# Patient Record
Sex: Male | Born: 1960 | Hispanic: Yes | Marital: Married | Smoking: Former smoker
Health system: Southern US, Community
[De-identification: ages and names within clinical notes are randomized; demographics above are authoritative.]

## PROBLEM LIST (undated history)

## (undated) DIAGNOSIS — Z789 Other specified health status: Secondary | ICD-10-CM

## (undated) HISTORY — PX: APPENDECTOMY: SHX54

---

## 2017-12-14 ENCOUNTER — Ambulatory Visit
Admission: RE | Admit: 2017-12-14 | Discharge: 2017-12-14 | Disposition: A | Discharge status: Home / Self Care | Payer: Self-pay | Source: Ambulatory Visit | Attending: Family Medicine | Admitting: Family Medicine

## 2017-12-14 ENCOUNTER — Other Ambulatory Visit: Payer: Self-pay | Admitting: Family Medicine

## 2017-12-14 ENCOUNTER — Ambulatory Visit
Admission: RE | Admit: 2017-12-14 | Discharge: 2017-12-14 | Disposition: A | Discharge status: Home / Self Care | Payer: Worker's Compensation | Source: Ambulatory Visit | Attending: Family Medicine | Admitting: Family Medicine

## 2017-12-14 DIAGNOSIS — M545 Low back pain: Secondary | ICD-10-CM | POA: Insufficient documentation

## 2017-12-14 DIAGNOSIS — R52 Pain, unspecified: Secondary | ICD-10-CM

## 2017-12-14 DIAGNOSIS — M25512 Pain in left shoulder: Secondary | ICD-10-CM | POA: Insufficient documentation

## 2017-12-14 DIAGNOSIS — M546 Pain in thoracic spine: Secondary | ICD-10-CM | POA: Insufficient documentation

## 2017-12-14 DIAGNOSIS — M542 Cervicalgia: Secondary | ICD-10-CM | POA: Insufficient documentation

## 2017-12-14 DIAGNOSIS — M5137 Other intervertebral disc degeneration, lumbosacral region: Secondary | ICD-10-CM | POA: Insufficient documentation

## 2018-08-29 ENCOUNTER — Observation Stay
Admission: EM | Admit: 2018-08-29 | Discharge: 2018-08-31 | Disposition: A | Discharge status: Home / Self Care | Payer: BC Managed Care – PPO | Attending: Surgery | Admitting: Surgery

## 2018-08-29 ENCOUNTER — Other Ambulatory Visit: Payer: Self-pay

## 2018-08-29 ENCOUNTER — Encounter: Payer: Self-pay | Admitting: Emergency Medicine

## 2018-08-29 DIAGNOSIS — Z1159 Encounter for screening for other viral diseases: Secondary | ICD-10-CM | POA: Diagnosis not present

## 2018-08-29 DIAGNOSIS — K37 Unspecified appendicitis: Secondary | ICD-10-CM | POA: Diagnosis present

## 2018-08-29 DIAGNOSIS — K3580 Unspecified acute appendicitis: Secondary | ICD-10-CM | POA: Diagnosis not present

## 2018-08-29 LAB — COMPREHENSIVE METABOLIC PANEL
ALT: 41 U/L (ref 0–44)
AST: 34 U/L (ref 15–41)
Albumin: 4.4 g/dL (ref 3.5–5.0)
Alkaline Phosphatase: 84 U/L (ref 38–126)
Anion gap: 10 (ref 5–15)
BUN: 18 mg/dL (ref 6–20)
CO2: 24 mmol/L (ref 22–32)
Calcium: 9.1 mg/dL (ref 8.9–10.3)
Chloride: 106 mmol/L (ref 98–111)
Creatinine, Ser: 0.86 mg/dL (ref 0.61–1.24)
GFR calc Af Amer: >60 mL/min (ref >60)
GFR calc non Af Amer: >60 mL/min (ref >60)
Glucose, Bld: 124 mg/dL — ABNORMAL HIGH (ref 70–99)
Potassium: 3.6 mmol/L (ref 3.5–5.1)
Sodium: 140 mmol/L (ref 135–145)
Total Bilirubin: 0.7 mg/dL (ref 0.3–1.2)
Total Protein: 7.6 g/dL (ref 6.5–8.1)

## 2018-08-29 LAB — URINALYSIS, COMPLETE (UACMP) WITH MICROSCOPIC
Bacteria, UA: NONE SEEN
Bilirubin Urine: NEGATIVE
Glucose, UA: NEGATIVE mg/dL
Hgb urine dipstick: NEGATIVE
Ketones, ur: 5 mg/dL — AB
Leukocytes,Ua: NEGATIVE
Nitrite: NEGATIVE
Protein, ur: 30 mg/dL — AB
Specific Gravity, Urine: 1.028 (ref 1.005–1.030)
Squamous Epithelial / LPF: NONE SEEN (ref 0–5)
WBC, UA: NONE SEEN WBC/hpf (ref 0–5)
pH: 7 (ref 5.0–8.0)

## 2018-08-29 LAB — CBC WITH DIFFERENTIAL/PLATELET
Abs Immature Granulocytes: 0.04 10*3/uL (ref 0.00–0.07)
Basophils Absolute: 0.1 10*3/uL (ref 0.0–0.1)
Basophils Relative: 0 %
Eosinophils Absolute: 0 10*3/uL (ref 0.0–0.5)
Eosinophils Relative: 0 %
HCT: 43.9 % (ref 39.0–52.0)
Hemoglobin: 14.6 g/dL (ref 13.0–17.0)
Immature Granulocytes: 0 %
Lymphocytes Relative: 13 %
Lymphs Abs: 1.8 10*3/uL (ref 0.7–4.0)
MCH: 29.4 pg (ref 26.0–34.0)
MCHC: 33.3 g/dL (ref 30.0–36.0)
MCV: 88.5 fL (ref 80.0–100.0)
Monocytes Absolute: 0.5 10*3/uL (ref 0.1–1.0)
Monocytes Relative: 4 %
Neutro Abs: 11.1 10*3/uL — ABNORMAL HIGH (ref 1.7–7.7)
Neutrophils Relative %: 83 %
Platelets: 232 10*3/uL (ref 150–400)
RBC: 4.96 MIL/uL (ref 4.22–5.81)
RDW: 13 % (ref 11.5–15.5)
WBC: 13.5 10*3/uL — ABNORMAL HIGH (ref 4.0–10.5)
nRBC: 0 % (ref 0.0–0.2)

## 2018-08-29 LAB — LIPASE, BLOOD: Lipase: 41 U/L (ref 11–51)

## 2018-08-29 MED ORDER — IOHEXOL 240 MG/ML SOLN
50.0000 mL | Freq: Once | INTRAMUSCULAR | Status: AC | PRN
  Administered 2018-08-29: 50 mL via ORAL

## 2018-08-29 MED ORDER — SODIUM CHLORIDE 0.9 % IV BOLUS
500.0000 mL | Freq: Once | INTRAVENOUS | Status: AC
  Administered 2018-08-29: 500 mL via INTRAVENOUS

## 2018-08-29 NOTE — ED Triage Notes (Signed)
Patient ambulatory to triage with steady gait, without difficulty or distress noted, mask in place; pt reports since this am having left lower abd pain radiating across abd with no accomp symptoms; denies hx of same

## 2018-08-29 NOTE — ED Notes (Signed)
Patient transported to CT 

## 2018-08-29 NOTE — ED Provider Notes (Addendum)
[facility] Emergency Department Provider Note  ____________________________________________   I have reviewed the triage vital signs and the nursing notes. Where available I have reviewed prior notes and, if possible and indicated, outside hospital notes.    HISTORY  Chief Complaint Abdominal Pain    HPI Ricardo Ayers is a 58 y.o. male patient seen and evaluated during the coronavirus epidemic during a time with low staffing Who presents today complaining of left lower quadrant abdominal pain since this morning.  No fever no chills no vomiting.  Had a bowel movement that was "light but hard" no melena no bright red blood per rectum.  No other symptoms except for a intermittent, left-sided abdominal discomfort.  No flank pain.  No hematuria no personal family history of any stones that he is aware of.  He states the pain is minor right now but sometimes seems to be worse.   History reviewed. No pertinent past medical history.  There are no active problems to display for this patient.   History reviewed. No pertinent surgical history.  Prior to Admission medications   Not on File    Allergies Patient has no known allergies.  No family history on file.  Social History Social History   Tobacco Use  . Smoking status: Never Smoker  . Smokeless tobacco: Never Used  Substance Use Topics  . Alcohol use: Not on file  . Drug use: Not on file    Review of Systems Constitutional: No fever/chills Eyes: No visual changes. ENT: No sore throat. No stiff neck no neck pain Cardiovascular: Denies chest pain. Respiratory: Denies shortness of breath. Gastrointestinal:   no vomiting.  No diarrhea.  No constipation. Genitourinary: Negative for dysuria. Musculoskeletal: Negative lower extremity swelling Skin: Negative for rash. Neurological: Negative for severe headaches, focal weakness or  numbness.   ____________________________________________   PHYSICAL EXAM:  VITAL SIGNS: ED Triage Vitals [08/29/18 1918]  Enc Vitals Group     BP 125/66     Pulse Rate (!) 58     Resp 18     Temp 98 F (36.7 C)     Temp Source Oral     SpO2 99 %     Weight 180 lb (81.6 kg)     Height 5\' 5"  (1.651 m)     Head Circumference      Peak Flow      Pain Score 8     Pain Loc      Pain Edu?      Excl. in GC?     Constitutional: Alert and oriented. Well appearing and in no acute distress. Eyes: Conjunctivae are normal Head: Atraumatic HEENT: No congestion/rhinnorhea. Mucous membranes are moist.  Oropharynx non-erythematous Neck:   Nontender with no meningismus, no masses, no stridor Cardiovascular: Normal rate, regular rhythm. Grossly normal heart sounds.  Good peripheral circulation. Respiratory: Normal respiratory effort.  No retractions. Lungs CTAB. Abdominal: Soft and left lower quadrant tenderness with voluntary guarding no rebound nonsurgical abdomen. No distention. No guarding no rebound Back:  There is no focal tenderness or step off.  there is no midline tenderness there are no lesions noted. there is no CVA tenderness Normal external genitalia nontender Musculoskeletal: No lower extremity tenderness, no upper extremity tenderness. No joint effusions, no DVT signs strong distal pulses no edema Neurologic:  Normal speech and language. No gross focal neurologic deficits are appreciated.  Skin:  Skin is warm, dry and intact. No rash noted. Psychiatric: Mood and affect are  normal. Speech and behavior are normal.  ____________________________________________   LABS (all labs ordered are listed, but only abnormal results are displayed)  Labs Reviewed  CBC WITH DIFFERENTIAL/PLATELET - Abnormal; Notable for the following components:      Result Value   WBC 13.5 (*)    Neutro Abs 11.1 (*)    All other components within normal limits  COMPREHENSIVE METABOLIC PANEL -  Abnormal; Notable for the following components:   Glucose, Bld 124 (*)    All other components within normal limits  URINALYSIS, COMPLETE (UACMP) WITH MICROSCOPIC - Abnormal; Notable for the following components:   Color, Urine YELLOW (*)    APPearance CLOUDY (*)    Ketones, ur 5 (*)    Protein, ur 30 (*)    All other components within normal limits  LIPASE, BLOOD    Pertinent labs  results that were available during my care of the patient were reviewed by me and considered in my medical decision making (see chart for details). ____________________________________________  EKG  I personally interpreted any EKGs ordered by me or triage  _______________________________________  RADIOLOGY  Pertinent labs & imaging results that were available during my care of the patient were reviewed by me and considered in my medical decision making (see chart for details). If possible, patient and/or family made aware of any abnormal findings.  No results found. ____________________________________________    PROCEDURES  Procedure(s) performed: None  Procedures  Critical Care performed: None  ____________________________________________   INITIAL IMPRESSION / ASSESSMENT AND PLAN / ED COURSE  Pertinent labs & imaging results that were available during my care of the patient were reviewed by me and considered in my medical decision making (see chart for details).  Patient here with left lower quadrant abdominal pain differential includes constipation, diverticulitis, kidney stone.  He does not have any flank pain however hematuria, we will give him IV fluid, he declines pain medication at this time, we will obtain a CT scan and we will reassess.  ----------------------------------------- 11:07 PM on 08/29/2018 -----------------------------------------  Signed out to dr. Manson Passey at the end of my shift.    ____________________________________________   FINAL CLINICAL IMPRESSION(S) /  ED DIAGNOSES  Final diagnoses:  None      This chart was dictated using voice recognition software.  Despite best efforts to proofread,  errors can occur which can change meaning.      Jeanmarie Plant, MD 08/29/18 2225    Jeanmarie Plant, MD 08/29/18 [phone]

## 2018-08-30 ENCOUNTER — Encounter: Payer: Self-pay | Admitting: Radiology

## 2018-08-30 ENCOUNTER — Encounter
Admission: EM | Disposition: A | Discharge status: Home / Self Care | Payer: Self-pay | Source: Home / Self Care | Attending: Emergency Medicine

## 2018-08-30 ENCOUNTER — Observation Stay: Payer: BC Managed Care – PPO | Admitting: Anesthesiology

## 2018-08-30 ENCOUNTER — Emergency Department: Payer: BC Managed Care – PPO

## 2018-08-30 DIAGNOSIS — K3580 Unspecified acute appendicitis: Principal | ICD-10-CM

## 2018-08-30 DIAGNOSIS — K37 Unspecified appendicitis: Secondary | ICD-10-CM | POA: Diagnosis present

## 2018-08-30 HISTORY — PX: LAPAROSCOPIC APPENDECTOMY: SHX408

## 2018-08-30 LAB — CREATININE, SERUM
Creatinine, Ser: 0.72 mg/dL (ref 0.61–1.24)
GFR calc Af Amer: >60 mL/min (ref >60)
GFR calc non Af Amer: >60 mL/min (ref >60)

## 2018-08-30 LAB — MRSA PCR SCREENING: MRSA by PCR: NEGATIVE

## 2018-08-30 LAB — CBC
HCT: 42 % (ref 39.0–52.0)
Hemoglobin: 13.8 g/dL (ref 13.0–17.0)
MCH: 29.5 pg (ref 26.0–34.0)
MCHC: 32.9 g/dL (ref 30.0–36.0)
MCV: 89.7 fL (ref 80.0–100.0)
Platelets: 198 10*3/uL (ref 150–400)
RBC: 4.68 MIL/uL (ref 4.22–5.81)
RDW: 13.1 % (ref 11.5–15.5)
WBC: 11.2 10*3/uL — ABNORMAL HIGH (ref 4.0–10.5)
nRBC: 0 % (ref 0.0–0.2)

## 2018-08-30 LAB — SARS CORONAVIRUS 2 BY RT PCR (HOSPITAL ORDER, PERFORMED IN ~~LOC~~ HOSPITAL LAB): SARS Coronavirus 2: NEGATIVE

## 2018-08-30 SURGERY — APPENDECTOMY, LAPAROSCOPIC
Anesthesia: General

## 2018-08-30 MED ORDER — MIDAZOLAM HCL 2 MG/2ML IJ SOLN
INTRAMUSCULAR | Status: DC | PRN
  Administered 2018-08-30: 2 mg via INTRAVENOUS

## 2018-08-30 MED ORDER — ONDANSETRON HCL 4 MG/2ML IJ SOLN: 4.0000 mg | Freq: Four times a day (QID) | INTRAMUSCULAR | Status: DC | PRN

## 2018-08-30 MED ORDER — IOHEXOL 300 MG/ML  SOLN
100.0000 mL | Freq: Once | INTRAMUSCULAR | Status: AC | PRN
  Administered 2018-08-30: 100 mL via INTRAVENOUS

## 2018-08-30 MED ORDER — PROCHLORPERAZINE MALEATE 10 MG PO TABS
10.0000 mg | ORAL_TABLET | Freq: Four times a day (QID) | ORAL | Status: DC | PRN
  Filled 2018-08-30: qty 1

## 2018-08-30 MED ORDER — OXYCODONE HCL 5 MG/5ML PO SOLN: 5.0000 mg | Freq: Once | ORAL | Status: DC | PRN

## 2018-08-30 MED ORDER — PIPERACILLIN-TAZOBACTAM 3.375 G IVPB
3.3750 g | Freq: Three times a day (TID) | INTRAVENOUS | Status: DC
  Administered 2018-08-30 (×2): 3.375 g via INTRAVENOUS
  Filled 2018-08-30 (×2): qty 50

## 2018-08-30 MED ORDER — SUGAMMADEX SODIUM 200 MG/2ML IV SOLN
INTRAVENOUS | Status: AC
  Filled 2018-08-30: qty 2

## 2018-08-30 MED ORDER — PANTOPRAZOLE SODIUM 40 MG IV SOLR
40.0000 mg | Freq: Every day | INTRAVENOUS | Status: DC
  Administered 2018-08-30 (×2): 40 mg via INTRAVENOUS
  Filled 2018-08-30 (×2): qty 40

## 2018-08-30 MED ORDER — DEXAMETHASONE SODIUM PHOSPHATE 10 MG/ML IJ SOLN
INTRAMUSCULAR | Status: DC | PRN
  Administered 2018-08-30: 10 mg via INTRAVENOUS

## 2018-08-30 MED ORDER — SODIUM CHLORIDE 0.9 % IV BOLUS
1000.0000 mL | Freq: Once | INTRAVENOUS | Status: AC
  Administered 2018-08-30: 01:00:00 1000 mL via INTRAVENOUS

## 2018-08-30 MED ORDER — GLYCOPYRROLATE 0.2 MG/ML IJ SOLN
INTRAMUSCULAR | Status: AC
  Filled 2018-08-30: qty 1

## 2018-08-30 MED ORDER — PROPOFOL 10 MG/ML IV BOLUS
INTRAVENOUS | Status: DC | PRN
  Administered 2018-08-30: 150 mg via INTRAVENOUS

## 2018-08-30 MED ORDER — SODIUM CHLORIDE 0.9 % IV SOLN
INTRAVENOUS | Status: DC
  Administered 2018-08-30 (×3): via INTRAVENOUS

## 2018-08-30 MED ORDER — ACETAMINOPHEN 10 MG/ML IV SOLN
INTRAVENOUS | Status: DC | PRN
  Administered 2018-08-30: 1000 mg via INTRAVENOUS

## 2018-08-30 MED ORDER — DEXAMETHASONE SODIUM PHOSPHATE 10 MG/ML IJ SOLN
INTRAMUSCULAR | Status: AC
  Filled 2018-08-30: qty 1

## 2018-08-30 MED ORDER — ROCURONIUM BROMIDE 100 MG/10ML IV SOLN
INTRAVENOUS | Status: DC | PRN
  Administered 2018-08-30: 5 mg via INTRAVENOUS
  Administered 2018-08-30: 10 mg via INTRAVENOUS
  Administered 2018-08-30: 35 mg via INTRAVENOUS

## 2018-08-30 MED ORDER — KETOROLAC TROMETHAMINE 30 MG/ML IJ SOLN
30.0000 mg | Freq: Four times a day (QID) | INTRAMUSCULAR | Status: DC | PRN
  Administered 2018-08-30 – 2018-08-31 (×2): 30 mg via INTRAVENOUS
  Filled 2018-08-30 (×2): qty 1

## 2018-08-30 MED ORDER — FENTANYL CITRATE (PF) 100 MCG/2ML IJ SOLN
INTRAMUSCULAR | Status: AC
  Filled 2018-08-30: qty 2

## 2018-08-30 MED ORDER — LIDOCAINE-EPINEPHRINE (PF) 1 %-1:200000 IJ SOLN
INTRAMUSCULAR | Status: DC | PRN
  Administered 2018-08-30: 10 mL via INTRAMUSCULAR

## 2018-08-30 MED ORDER — SUCCINYLCHOLINE CHLORIDE 20 MG/ML IJ SOLN
INTRAMUSCULAR | Status: DC | PRN
  Administered 2018-08-30: 100 mg via INTRAVENOUS

## 2018-08-30 MED ORDER — HYDRALAZINE HCL 20 MG/ML IJ SOLN: 10.0000 mg | INTRAMUSCULAR | Status: DC | PRN

## 2018-08-30 MED ORDER — MORPHINE SULFATE (PF) 2 MG/ML IV SOLN: 2.0000 mg | INTRAVENOUS | Status: DC | PRN

## 2018-08-30 MED ORDER — ONDANSETRON 4 MG PO TBDP: 4.0000 mg | ORAL_TABLET | Freq: Four times a day (QID) | ORAL | Status: DC | PRN

## 2018-08-30 MED ORDER — ACETAMINOPHEN 10 MG/ML IV SOLN
INTRAVENOUS | Status: AC
  Filled 2018-08-30: qty 100

## 2018-08-30 MED ORDER — PROPOFOL 10 MG/ML IV BOLUS
INTRAVENOUS | Status: AC
  Filled 2018-08-30: qty 20

## 2018-08-30 MED ORDER — ONDANSETRON HCL 4 MG/2ML IJ SOLN
INTRAMUSCULAR | Status: AC
  Filled 2018-08-30: qty 2

## 2018-08-30 MED ORDER — ONDANSETRON HCL 4 MG/2ML IJ SOLN
INTRAMUSCULAR | Status: DC | PRN
  Administered 2018-08-30: 4 mg via INTRAVENOUS

## 2018-08-30 MED ORDER — MIDAZOLAM HCL 2 MG/2ML IJ SOLN
INTRAMUSCULAR | Status: AC
  Filled 2018-08-30: qty 2

## 2018-08-30 MED ORDER — LIDOCAINE HCL (CARDIAC) PF 100 MG/5ML IV SOSY
PREFILLED_SYRINGE | INTRAVENOUS | Status: DC | PRN
  Administered 2018-08-30: 50 mg via INTRAVENOUS

## 2018-08-30 MED ORDER — SODIUM CHLORIDE 0.9 % IV SOLN
INTRAVENOUS | Status: DC
  Administered 2018-08-30 – 2018-08-31 (×2): via INTRAVENOUS

## 2018-08-30 MED ORDER — FENTANYL CITRATE (PF) 100 MCG/2ML IJ SOLN
INTRAMUSCULAR | Status: AC
  Administered 2018-08-30: 18:00:00 50 ug via INTRAVENOUS
  Filled 2018-08-30: qty 2

## 2018-08-30 MED ORDER — SUGAMMADEX SODIUM 200 MG/2ML IV SOLN
INTRAVENOUS | Status: DC | PRN
  Administered 2018-08-30: 200 mg via INTRAVENOUS

## 2018-08-30 MED ORDER — ROCURONIUM BROMIDE 50 MG/5ML IV SOLN
INTRAVENOUS | Status: AC
  Filled 2018-08-30: qty 1

## 2018-08-30 MED ORDER — LIDOCAINE HCL (PF) 2 % IJ SOLN
INTRAMUSCULAR | Status: AC
  Filled 2018-08-30: qty 10

## 2018-08-30 MED ORDER — OXYCODONE HCL 5 MG PO TABS
5.0000 mg | ORAL_TABLET | Freq: Once | ORAL | Status: DC | PRN
  Filled 2018-08-30: qty 1

## 2018-08-30 MED ORDER — PROCHLORPERAZINE EDISYLATE 10 MG/2ML IJ SOLN
5.0000 mg | Freq: Four times a day (QID) | INTRAMUSCULAR | Status: DC | PRN
  Filled 2018-08-30: qty 2

## 2018-08-30 MED ORDER — PIPERACILLIN-TAZOBACTAM 3.375 G IVPB 30 MIN
3.3750 g | Freq: Once | INTRAVENOUS | Status: AC
  Administered 2018-08-30: 3.375 g via INTRAVENOUS
  Filled 2018-08-30: qty 50

## 2018-08-30 MED ORDER — SODIUM CHLORIDE 0.9 % IV BOLUS
1000.0000 mL | Freq: Once | INTRAVENOUS | Status: AC
  Administered 2018-08-30: 02:00:00 1000 mL via INTRAVENOUS

## 2018-08-30 MED ORDER — HEPARIN SODIUM (PORCINE) 5000 UNIT/ML IJ SOLN
5000.0000 [IU] | Freq: Three times a day (TID) | INTRAMUSCULAR | Status: DC
  Administered 2018-08-30: 03:00:00 5000 [IU] via SUBCUTANEOUS
  Filled 2018-08-30: qty 1

## 2018-08-30 MED ORDER — FENTANYL CITRATE (PF) 100 MCG/2ML IJ SOLN
25.0000 ug | INTRAMUSCULAR | Status: DC | PRN
  Administered 2018-08-30 (×2): 50 ug via INTRAVENOUS

## 2018-08-30 MED ORDER — GLYCOPYRROLATE 0.2 MG/ML IJ SOLN
INTRAMUSCULAR | Status: DC | PRN
  Administered 2018-08-30: 0.2 mg via INTRAVENOUS

## 2018-08-30 MED ORDER — FENTANYL CITRATE (PF) 100 MCG/2ML IJ SOLN
INTRAMUSCULAR | Status: DC | PRN
  Administered 2018-08-30 (×3): 50 ug via INTRAVENOUS

## 2018-08-30 MED ORDER — SODIUM CHLORIDE 0.9 % IV SOLN
INTRAVENOUS | Status: DC | PRN
  Administered 2018-08-30: 1 mL via INTRAVENOUS

## 2018-08-30 SURGICAL SUPPLY — 53 items
APPLICATOR COTTON TIP 6 STRL (MISCELLANEOUS) ×1 IMPLANT
APPLICATOR COTTON TIP 6IN STRL (MISCELLANEOUS) ×2
APPLIER CLIP 5 13 M/L LIGAMAX5 (MISCELLANEOUS) ×2
BLADE SURG SZ11 CARB STEEL (BLADE) ×2 IMPLANT
CANISTER SUCT 1200ML W/VALVE (MISCELLANEOUS) ×2 IMPLANT
CHLORAPREP W/TINT 26 (MISCELLANEOUS) ×2 IMPLANT
CLIP APPLIE 5 13 M/L LIGAMAX5 (MISCELLANEOUS) ×1 IMPLANT
COVER WAND RF STERILE (DRAPES) ×2 IMPLANT
CUTTER FLEX LINEAR 45M (STAPLE) ×2 IMPLANT
DEFOGGER SCOPE WARMER CLEARIFY (MISCELLANEOUS) ×2 IMPLANT
DERMABOND ADVANCED (GAUZE/BANDAGES/DRESSINGS) ×1
DERMABOND ADVANCED .7 DNX12 (GAUZE/BANDAGES/DRESSINGS) ×1 IMPLANT
ELECT CAUTERY BLADE 6.4 (BLADE) ×2 IMPLANT
ELECT CAUTERY BLADE TIP 2.5 (TIP) ×2
ELECT REM PT RETURN 9FT ADLT (ELECTROSURGICAL) ×2
ELECTRODE CAUTERY BLDE TIP 2.5 (TIP) ×1 IMPLANT
ELECTRODE REM PT RTRN 9FT ADLT (ELECTROSURGICAL) ×1 IMPLANT
GLOVE BIO SURGEON STRL SZ 6.5 (GLOVE) ×2 IMPLANT
GLOVE INDICATOR 7.0 STRL GRN (GLOVE) ×4 IMPLANT
GOWN STRL REUS W/ TWL LRG LVL3 (GOWN DISPOSABLE) ×2 IMPLANT
GOWN STRL REUS W/TWL LRG LVL3 (GOWN DISPOSABLE) ×2
GRASPER SUT TROCAR 14GX15 (MISCELLANEOUS) ×2 IMPLANT
HEMOSTAT SURGICEL 2X3 (HEMOSTASIS) ×2 IMPLANT
IRRIGATION STRYKERFLOW (MISCELLANEOUS) ×1 IMPLANT
IRRIGATOR STRYKERFLOW (MISCELLANEOUS) ×2
IV NS 1000ML (IV SOLUTION) ×1
IV NS 1000ML BAXH (IV SOLUTION) ×1 IMPLANT
KIT TURNOVER KIT A (KITS) ×2 IMPLANT
KITTNER LAPARASCOPIC 5X40 (MISCELLANEOUS) ×2 IMPLANT
LABEL OR SOLS (LABEL) ×2 IMPLANT
LIGASURE LAP MARYLAND 5MM 37CM (ELECTROSURGICAL) IMPLANT
NEEDLE HYPO 22GX1.5 SAFETY (NEEDLE) ×2 IMPLANT
NS IRRIG 500ML POUR BTL (IV SOLUTION) ×2 IMPLANT
PACK LAP CHOLECYSTECTOMY (MISCELLANEOUS) ×2 IMPLANT
PENCIL ELECTRO HAND CTR (MISCELLANEOUS) ×2 IMPLANT
POUCH SPECIMEN RETRIEVAL 10MM (ENDOMECHANICALS) ×2 IMPLANT
RELOAD 45 VASCULAR/THIN (ENDOMECHANICALS) IMPLANT
RELOAD STAPLE TA45 3.5 REG BLU (ENDOMECHANICALS) ×2 IMPLANT
SCISSORS METZENBAUM CVD 33 (INSTRUMENTS) ×2 IMPLANT
SET TUBE SMOKE EVAC HIGH FLOW (TUBING) ×2 IMPLANT
SHEARS HARMONIC ACE PLUS 36CM (ENDOMECHANICALS) ×2 IMPLANT
SLEEVE ADV FIXATION 5X100MM (TROCAR) ×4 IMPLANT
STRIP CLOSURE SKIN 1/2X4 (GAUZE/BANDAGES/DRESSINGS) ×2 IMPLANT
SUT MNCRL 4-0 (SUTURE) ×1
SUT MNCRL 4-0 27XMFL (SUTURE) ×1
SUT VIC AB 3-0 SH 27 (SUTURE) ×1
SUT VIC AB 3-0 SH 27X BRD (SUTURE) ×1 IMPLANT
SUT VICRYL 0 AB UR-6 (SUTURE) ×2 IMPLANT
SUTURE MNCRL 4-0 27XMF (SUTURE) ×1 IMPLANT
TRAY FOLEY MTR SLVR 16FR STAT (SET/KITS/TRAYS/PACK) ×2 IMPLANT
TROCAR 130MM GELPORT  DAV (MISCELLANEOUS) ×2 IMPLANT
TROCAR ADV FIXATION 12X100MM (TROCAR) IMPLANT
TROCAR Z-THREAD OPTICAL 5X100M (TROCAR) ×2 IMPLANT

## 2018-08-30 NOTE — Anesthesia Post-op Follow-up Note (Signed)
Anesthesia QCDR form completed.        

## 2018-08-30 NOTE — Progress Notes (Signed)
D/w Dr. Manson Passey ABD pain increase WBC and CT reviewed c/w appendicitis , no perforation or abscess. Pt non toxic. We will admit, start IB a/bs and plan for lap appy in am. Dr. Lady Gary is on call starting at 7am today and she will likely perform the procedure.

## 2018-08-30 NOTE — Anesthesia Postprocedure Evaluation (Signed)
Anesthesia Post Note  Patient: Jon Basch Pantaleon  Procedure(s) Performed: APPENDECTOMY LAPAROSCOPIC (N/A )  Patient location during evaluation: PACU Anesthesia Type: General Level of consciousness: awake and alert Pain management: pain level controlled Vital Signs Assessment: post-procedure vital signs reviewed and stable Respiratory status: spontaneous breathing, nonlabored ventilation, respiratory function stable and patient connected to nasal cannula oxygen Cardiovascular status: blood pressure returned to baseline and stable Postop Assessment: no apparent nausea or vomiting Anesthetic complications: no     Last Vitals:  Vitals:   08/30/18 1853 08/30/18 1900  BP: 127/85 125/77  Pulse: 64 69  Resp: 16 17  Temp: 37 C 37.1 C  SpO2: 100% 99%    Last Pain:  Vitals:   08/30/18 2029  TempSrc:   PainSc: 0-No pain                 Cleda Mccreedy Piscitello

## 2018-08-30 NOTE — H&P (Addendum)
[facility] SURGICAL HISTORY & PHYSICAL (cpt [phone])  HISTORY OF PRESENT ILLNESS (HPI):  58 y.o. male presented to [facility] ED overnight for abdominal pain. Patient reports left sided abdominal pain which onset yesterday afternoon. He reports the pain waxed and waned throughout the day but has resolved since admission. He did not try anything to improve the pain. He has some associated nausea but that resolved. No fevers, chills, cough, congestion, SOB, CP, or bladder/bowel changes. No history of similar pain. No previous abdominal surgeries. Work up in the ED was concerning for acute appendicitis.   General surgery is consulted by emergency medicine physician Dr Bayard Males, MD for evaluation and management of acute appendicitis.     PAST MEDICAL HISTORY (PMH):  History reviewed. No pertinent past medical history.  Reviewed. Otherwise negative.   PAST SURGICAL HISTORY (PSH):  History reviewed. No pertinent surgical history.  Reviewed. Otherwise negative.   MEDICATIONS:  Prior to Admission medications   Medication Sig Start Date End Date Taking? Authorizing Provider  acetaminophen (TYLENOL) 325 MG tablet Take 650 mg by mouth every 6 (six) hours as needed for mild pain.   Yes [provider]     ALLERGIES:  No Known Allergies   SOCIAL HISTORY:  Social History   Socioeconomic History  . Marital status: Married    Spouse name: Not on file  . Number of children: Not on file  . Years of education: Not on file  . Highest education level: Not on file  Occupational History  . Not on file  Social Needs  . Financial resource strain: Not on file  . Food insecurity:    Worry: Not on file    Inability: Not on file  . Transportation needs:    Medical: Not on file    Non-medical: Not on file  Tobacco Use  . Smoking status: Never Smoker  . Smokeless tobacco: Never Used  Substance and Sexual Activity  . Alcohol use: Not on file  . Drug use: Not on file  .  Sexual activity: Not on file  Lifestyle  . Physical activity:    Days per week: Not on file    Minutes per session: Not on file  . Stress: Not on file  Relationships  . Social connections:    Talks on phone: Not on file    Gets together: Not on file    Attends religious service: Not on file    Active member of club or organization: Not on file    Attends meetings of clubs or organizations: Not on file    Relationship status: Not on file  . Intimate partner violence:    Fear of current or ex partner: Not on file    Emotionally abused: Not on file    Physically abused: Not on file    Forced sexual activity: Not on file  Other Topics Concern  . Not on file  Social History Narrative  . Not on file     FAMILY HISTORY:  No family history on file.  Otherwise negative.   REVIEW OF SYSTEMS:  Review of Systems  Constitutional: Negative for chills and fever.  HENT: Negative for congestion and sore throat.   Respiratory: Negative for cough and shortness of breath.   Cardiovascular: Negative for chest pain and palpitations.  Gastrointestinal: Positive for abdominal pain and nausea. Negative for constipation, diarrhea and vomiting.  Genitourinary: Negative for dysuria and urgency.  Neurological: Negative for dizziness and headaches.  All other systems reviewed  and are negative.   VITAL SIGNS:  Temp:  [97.7 F (36.5 C)-98.3 F (36.8 C)] 97.7 F (36.5 C) (06/04 0450) Pulse Rate:  [55-69] 66 (06/04 0450) Resp:  [17-20] 20 (06/04 0450) BP: (119-134)/(66-82) 122/80 (06/04 0450) SpO2:  [95 %-100 %] 100 % (06/04 0450) Weight:  [81.6 kg-82.8 kg] 82.8 kg (06/04 0212)     Height: 5\' 4"  (162.6 cm) Weight: 82.8 kg BMI (Calculated): 31.32   PHYSICAL EXAM:  Physical Exam Vitals signs and nursing note reviewed.  Constitutional:      General: He is not in acute distress.    Appearance: He is well-developed. He is obese. He is not ill-appearing.  HENT:     Head: Normocephalic and  atraumatic.  Eyes:     General: No scleral icterus.    Extraocular Movements: Extraocular movements intact.  Cardiovascular:     Rate and Rhythm: Normal rate and regular rhythm.     Heart sounds: Normal heart sounds. No murmur. No friction rub. No gallop.   Pulmonary:     Effort: Pulmonary effort is normal. No respiratory distress.     Breath sounds: Normal breath sounds. No wheezing or rhonchi.  Abdominal:     General: Abdomen is protuberant. There is no distension.     Palpations: Abdomen is soft.     Tenderness: There is generalized abdominal tenderness. There is no guarding or rebound.  Genitourinary:    Comments: Deferred Skin:    General: Skin is warm and dry.     Coloration: Skin is not jaundiced or pale.  Neurological:     General: No focal deficit present.     Mental Status: He is alert and oriented to person, place, and time.  Psychiatric:        Mood and Affect: Mood normal.        Behavior: Behavior normal.     INTAKE/OUTPUT:  This shift: No intake/output data recorded.  Last 2 shifts: @IOLAST2SHIFTS @  Labs:  CBC Latest Ref Rng & Units 08/30/2018 08/29/2018  WBC 4.0 - 10.5 K/uL 11.2(H) 13.5(H)  Hemoglobin 13.0 - 17.0 g/dL 38.1 01.7  Hematocrit 51.0 - 52.0 % 42.0 43.9  Platelets 150 - 400 K/uL 198 232   CMP Latest Ref Rng & Units 08/30/2018 08/29/2018  Glucose 70 - 99 mg/dL - 258(N)  BUN 6 - 20 mg/dL - 18  Creatinine 2.77 - 1.24 mg/dL 8.24 2.35  Sodium 361 - 145 mmol/L - 140  Potassium 3.5 - 5.1 mmol/L - 3.6  Chloride 98 - 111 mmol/L - 106  CO2 22 - 32 mmol/L - 24  Calcium 8.9 - 10.3 mg/dL - 9.1  Total Protein 6.5 - 8.1 g/dL - 7.6  Total Bilirubin 0.3 - 1.2 mg/dL - 0.7  Alkaline Phos 38 - 126 U/L - 84  AST 15 - 41 U/L - 34  ALT 0 - 44 U/L - 41     Imaging studies:   CT Abdomen/Pelvis (08/30/2018) personally reviewed and radiologist report reviewed:  IMPRESSION: 1. Overall findings most concerning for acute uncomplicated appendicitis. The appendix is  located in the right lower quadrant and stretches across towards the patient's midline. 2. Mild cardiomegaly.   Assessment/Plan: (ICD-10's: K35.80) 58 y.o. male with generalized abdominal pain attributed to acute appendicitis without perforation or abscess.    - Admit to general surgery  - NPO + IVF + IV ABx (Zosyn)  - Pain control prn; antiemetics prn   - Monitor abdominal exam   - Will plan for  laparoscopic appendectomy with Dr Lady Garyannon today pending OR/Anesthesia availability.    - All risks, benefits, and alternatives to above procedure(s) were discussed with the patient, all of his questions were answered to his expressed satisfaction, patient expresses he wishes to proceed, and informed consent was obtained.   - Medical management of comorbidities   - DVT prophylaxis  All of the above findings and recommendations were discussed with the patient, and all of his questions were answered to his expressed satisfaction.  -- Lynden OxfordZachary Schulz, PA-C [facility] 08/30/2018, 7:22 AM [phone] M-F: 7am - 4pm  I saw and evaluated the patient.  I agree with the above documentation, exam, and plan, which I have edited where appropriate. Duanne GuessJennifer Dierra Riesgo  10:37 AM

## 2018-08-30 NOTE — Progress Notes (Deleted)
[facility] SURGICAL HISTORY & PHYSICAL (cpt [phone])  HISTORY OF PRESENT ILLNESS (HPI):  58 y.o. male presented to [facility] ED overnight for abdominal pain. Patient reports left sided abdominal pain which onset yesterday afternoon. He reports the pain waxed and waned throughout the day but has resolved since admission. He did not try anything to improve the pain. He has some associated nausea but that resolved. No fevers, chills, cough, congestion, SOB, CP, or bladder/bowel changes. No history of similar pain. No previous abdominal surgeries. Work up in the ED was concerning for acute appendicitis.   General surgery is consulted by emergency medicine physician Dr Bayard Males, MD for evaluation and management of acute appendicitis.     PAST MEDICAL HISTORY (PMH):  History reviewed. No pertinent past medical history.  Reviewed. Otherwise negative.   PAST SURGICAL HISTORY (PSH):  History reviewed. No pertinent surgical history.  Reviewed. Otherwise negative.   MEDICATIONS:  Prior to Admission medications   Medication Sig Start Date End Date Taking? Authorizing Provider  acetaminophen (TYLENOL) 325 MG tablet Take 650 mg by mouth every 6 (six) hours as needed for mild pain.   Yes [provider]     ALLERGIES:  No Known Allergies   SOCIAL HISTORY:  Social History   Socioeconomic History  . Marital status: Married    Spouse name: Not on file  . Number of children: Not on file  . Years of education: Not on file  . Highest education level: Not on file  Occupational History  . Not on file  Social Needs  . Financial resource strain: Not on file  . Food insecurity:    Worry: Not on file    Inability: Not on file  . Transportation needs:    Medical: Not on file    Non-medical: Not on file  Tobacco Use  . Smoking status: Never Smoker  . Smokeless tobacco: Never Used  Substance and Sexual Activity  . Alcohol use: Not on file  . Drug use: Not on file  .  Sexual activity: Not on file  Lifestyle  . Physical activity:    Days per week: Not on file    Minutes per session: Not on file  . Stress: Not on file  Relationships  . Social connections:    Talks on phone: Not on file    Gets together: Not on file    Attends religious service: Not on file    Active member of club or organization: Not on file    Attends meetings of clubs or organizations: Not on file    Relationship status: Not on file  . Intimate partner violence:    Fear of current or ex partner: Not on file    Emotionally abused: Not on file    Physically abused: Not on file    Forced sexual activity: Not on file  Other Topics Concern  . Not on file  Social History Narrative  . Not on file     FAMILY HISTORY:  No family history on file.  Otherwise negative.   REVIEW OF SYSTEMS:  Review of Systems  Constitutional: Negative for chills and fever.  HENT: Negative for congestion and sore throat.   Respiratory: Negative for cough and shortness of breath.   Cardiovascular: Negative for chest pain and palpitations.  Gastrointestinal: Positive for abdominal pain and nausea. Negative for constipation, diarrhea and vomiting.  Genitourinary: Negative for dysuria and urgency.  Neurological: Negative for dizziness and headaches.  All other systems reviewed  and are negative.   VITAL SIGNS:  Temp:  [97.7 F (36.5 C)-98.3 F (36.8 C)] 97.7 F (36.5 C) (06/04 0450) Pulse Rate:  [55-69] 66 (06/04 0450) Resp:  [17-20] 20 (06/04 0450) BP: (119-134)/(66-82) 122/80 (06/04 0450) SpO2:  [95 %-100 %] 100 % (06/04 0450) Weight:  [81.6 kg-82.8 kg] 82.8 kg (06/04 0212)     Height: 5\' 4"  (162.6 cm) Weight: 82.8 kg BMI (Calculated): 31.32   PHYSICAL EXAM:  Physical Exam Vitals signs and nursing note reviewed.  Constitutional:      General: He is not in acute distress.    Appearance: He is well-developed. He is obese. He is not ill-appearing.  HENT:     Head: Normocephalic and  atraumatic.  Eyes:     General: No scleral icterus.    Extraocular Movements: Extraocular movements intact.  Cardiovascular:     Rate and Rhythm: Normal rate and regular rhythm.     Heart sounds: Normal heart sounds. No murmur. No friction rub. No gallop.   Pulmonary:     Effort: Pulmonary effort is normal. No respiratory distress.     Breath sounds: Normal breath sounds. No wheezing or rhonchi.  Abdominal:     General: Abdomen is protuberant. There is no distension.     Palpations: Abdomen is soft.     Tenderness: There is generalized abdominal tenderness. There is no guarding or rebound.  Genitourinary:    Comments: Deferred Skin:    General: Skin is warm and dry.     Coloration: Skin is not jaundiced or pale.  Neurological:     General: No focal deficit present.     Mental Status: He is alert and oriented to person, place, and time.  Psychiatric:        Mood and Affect: Mood normal.        Behavior: Behavior normal.     INTAKE/OUTPUT:  This shift: No intake/output data recorded.  Last 2 shifts: @IOLAST2SHIFTS @  Labs:  CBC Latest Ref Rng & Units 08/30/2018 08/29/2018  WBC 4.0 - 10.5 K/uL 11.2(H) 13.5(H)  Hemoglobin 13.0 - 17.0 g/dL 38.1 01.7  Hematocrit 51.0 - 52.0 % 42.0 43.9  Platelets 150 - 400 K/uL 198 232   CMP Latest Ref Rng & Units 08/30/2018 08/29/2018  Glucose 70 - 99 mg/dL - 258(N)  BUN 6 - 20 mg/dL - 18  Creatinine 2.77 - 1.24 mg/dL 8.24 2.35  Sodium 361 - 145 mmol/L - 140  Potassium 3.5 - 5.1 mmol/L - 3.6  Chloride 98 - 111 mmol/L - 106  CO2 22 - 32 mmol/L - 24  Calcium 8.9 - 10.3 mg/dL - 9.1  Total Protein 6.5 - 8.1 g/dL - 7.6  Total Bilirubin 0.3 - 1.2 mg/dL - 0.7  Alkaline Phos 38 - 126 U/L - 84  AST 15 - 41 U/L - 34  ALT 0 - 44 U/L - 41     Imaging studies:   CT Abdomen/Pelvis (08/30/2018) personally reviewed and radiologist report reviewed:  IMPRESSION: 1. Overall findings most concerning for acute uncomplicated appendicitis. The appendix is  located in the right lower quadrant and stretches across towards the patient's midline. 2. Mild cardiomegaly.   Assessment/Plan: (ICD-10's: K35.80) 58 y.o. male with generalized abdominal pain attributed to acute appendicitis without perforation or abscess.    - Admit to general surgery  - NPO + IVF + IV ABx (Zosyn)  - Pain control prn; antiemetics prn   - Monitor abdominal exam   - Will plan for  laparoscopic appendectomy with Dr Lady Garyannon today pending OR/Anesthesia availability.    - All risks, benefits, and alternatives to above procedure(s) were discussed with the patient, all of his questions were answered to his expressed satisfaction, patient expresses he wishes to proceed, and informed consent was obtained.   - Medical management of comorbidities   - DVT prophylaxis  All of the above findings and recommendations were discussed with the patient, and all of his questions were answered to his expressed satisfaction.  -- Lynden OxfordZachary Airi Copado, PA-C [facility] 08/30/2018, 7:22 AM [phone] M-F: 7am - 4pm

## 2018-08-30 NOTE — Transfer of Care (Signed)
Immediate Anesthesia Transfer of Care Note  Patient: Ricardo Ayers  Procedure(s) Performed: APPENDECTOMY LAPAROSCOPIC (N/A )  Patient Location: PACU  Anesthesia Type:General  Level of Consciousness: awake  Airway & Oxygen Therapy: Patient Spontanous Breathing and Patient connected to face mask oxygen  Post-op Assessment: Report given to RN and Post -op Vital signs reviewed and stable  Post vital signs: Reviewed  Last Vitals:  Vitals Value Taken Time  BP 128/82 08/30/2018  5:41 PM  Temp 36.6 C 08/30/2018  5:41 PM  Pulse 66 08/30/2018  5:41 PM  Resp 20 08/30/2018  5:41 PM  SpO2 100 % 08/30/2018  5:41 PM  Vitals shown include unvalidated device data.  Last Pain:  Vitals:   08/30/18 1205  TempSrc: Oral  PainSc:          Complications: No apparent anesthesia complications

## 2018-08-30 NOTE — Anesthesia Preprocedure Evaluation (Signed)
Anesthesia Evaluation  Patient identified by MRN, date of birth, ID band Patient awake    Reviewed: Allergy & Precautions, H&P , NPO status , Patient's Chart, lab work & pertinent test results  History of Anesthesia Complications Negative for: history of anesthetic complications  Airway Mallampati: III  TM Distance: >3 FB Neck ROM: full    Dental  (+) Chipped   Pulmonary neg pulmonary ROS, neg shortness of breath,           Cardiovascular Exercise Tolerance: Good (-) angina(-) Past MI and (-) DOE negative cardio ROS       Neuro/Psych negative neurological ROS  negative psych ROS   GI/Hepatic negative GI ROS, Neg liver ROS, neg GERD  ,  Endo/Other  negative endocrine ROS  Renal/GU      Musculoskeletal   Abdominal   Peds  Hematology negative hematology ROS (+)   Anesthesia Other Findings History reviewed. No pertinent past medical history.  History reviewed. No pertinent surgical history.  BMI    Body Mass Index:  31.33 kg/m      Reproductive/Obstetrics negative OB ROS                             Anesthesia Physical Anesthesia Plan  ASA: II  Anesthesia Plan: General ETT and Rapid Sequence   Post-op Pain Management:    Induction: Intravenous  PONV Risk Score and Plan: Ondansetron, Dexamethasone, Midazolam and Treatment may vary due to age or medical condition  Airway Management Planned: Oral ETT  Additional Equipment:   Intra-op Plan:   Post-operative Plan: Extubation in OR  Informed Consent: I have reviewed the patients History and Physical, chart, labs and discussed the procedure including the risks, benefits and alternatives for the proposed anesthesia with the patient or authorized representative who has indicated his/her understanding and acceptance.     Dental Advisory Given  Plan Discussed with: Anesthesiologist, CRNA and Surgeon  Anesthesia Plan Comments:  (Consent via interpreter   Patient consented for risks of anesthesia including but not limited to:  - adverse reactions to medications - damage to teeth, lips or other oral mucosa - sore throat or hoarseness - Damage to heart, brain, lungs or loss of life  Patient voiced understanding.)        Anesthesia Quick Evaluation

## 2018-08-30 NOTE — Anesthesia Procedure Notes (Signed)
Procedure Name: Intubation Performed by: Kamree Wiens, CRNA Pre-anesthesia Checklist: Patient identified, Patient being monitored, Timeout performed, Emergency Drugs available and Suction available Patient Re-evaluated:Patient Re-evaluated prior to induction Oxygen Delivery Method: Circle system utilized Preoxygenation: Pre-oxygenation with 100% oxygen Induction Type: IV induction and Rapid sequence Laryngoscope Size: Miller and 2 Grade View: Grade II Tube type: Oral Tube size: 7.0 mm Number of attempts: 1 Airway Equipment and Method: Stylet Placement Confirmation: ETT inserted through vocal cords under direct vision,  positive ETCO2 and breath sounds checked- equal and bilateral Secured at: 21 cm Tube secured with: Tape Dental Injury: Teeth and Oropharynx as per pre-operative assessment        

## 2018-08-30 NOTE — ED Notes (Signed)
.. ED TO INPATIENT HANDOFF REPORT  ED Nurse Name and Phone #: Pattricia Boss [phone]  A Name/Age/Gender Dorethea Clan Pantaleon 58 y.o. male Room/Bed: ED03A/ED03A  Code Status   Code Status: Full Code  Home/SNF/Other Home Patient oriented to: self, place, time and situation Is this baseline? Yes   Triage Complete: Triage complete  Chief Complaint Abdominal Pain  Triage Note Patient ambulatory to triage with steady gait, without difficulty or distress noted, mask in place; pt reports since this am having left lower abd pain radiating across abd with no accomp symptoms; denies hx of same   Allergies No Known Allergies  Level of Care/Admitting Diagnosis ED Disposition    ED Disposition Condition Comment   Admit  Hospital Area: [facility] [[id number]]  Level of Care: Med-Surg [16]  Covid Evaluation: N/A  Diagnosis: Appendicitis [213086]  Admitting Physician: Leafy Ro [[id number]]  Attending Physician: Leafy Ro [[id number]]  PT Class (Do Not Modify): Observation [104]  PT Acc Code (Do Not Modify): Observation [10022]       B Medical/Surgery History History reviewed. No pertinent past medical history. History reviewed. No pertinent surgical history.   A IV Location/Drains/Wounds Patient Lines/Drains/Airways Status   Active Line/Drains/Airways    Name:   Placement date:   Placement time:   Site:   Days:   Peripheral IV 08/29/18 Left Antecubital   08/29/18    2230    Antecubital   1          Intake/Output Last 24 hours No intake or output data in the 24 hours ending 08/30/18 0129  Labs/Imaging Results for orders placed or performed during the hospital encounter of 08/29/18 (from the past 48 hour(s))  CBC with Differential     Status: Abnormal   Collection Time: 08/29/18  7:22 PM  Result Value Ref Range   WBC 13.5 (H) 4.0 - 10.5 K/uL   RBC 4.96 4.22 - 5.81 MIL/uL   Hemoglobin 14.6 13.0 - 17.0 g/dL   HCT 32.4 40.1 - 02.7 %   MCV 88.5 80.0  - 100.0 fL   MCH 29.4 26.0 - 34.0 pg   MCHC 33.3 30.0 - 36.0 g/dL   RDW 25.3 66.4 - 40.3 %   Platelets 232 150 - 400 K/uL   nRBC 0.0 0.0 - 0.2 %   Neutrophils Relative % 83 %   Neutro Abs 11.1 (H) 1.7 - 7.7 K/uL   Lymphocytes Relative 13 %   Lymphs Abs 1.8 0.7 - 4.0 K/uL   Monocytes Relative 4 %   Monocytes Absolute 0.5 0.1 - 1.0 K/uL   Eosinophils Relative 0 %   Eosinophils Absolute 0.0 0.0 - 0.5 K/uL   Basophils Relative 0 %   Basophils Absolute 0.1 0.0 - 0.1 K/uL   Immature Granulocytes 0 %   Abs Immature Granulocytes 0.04 0.00 - 0.07 K/uL    Comment: Performed at [facility], [address] [place], [place] [address]  Comprehensive metabolic panel     Status: Abnormal   Collection Time: 08/29/18  7:22 PM  Result Value Ref Range   Sodium 140 135 - 145 mmol/L   Potassium 3.6 3.5 - 5.1 mmol/L   Chloride 106 98 - 111 mmol/L   CO2 24 22 - 32 mmol/L   Glucose, Bld 124 (H) 70 - 99 mg/dL   BUN 18 6 - 20 mg/dL   Creatinine, Ser 9.56 0.61 - 1.24 mg/dL   Calcium 9.1 8.9 - 38.7 mg/dL   Total  Protein 7.6 6.5 - 8.1 g/dL   Albumin 4.4 3.5 - 5.0 g/dL   AST 34 15 - 41 U/L   ALT 41 0 - 44 U/L   Alkaline Phosphatase 84 38 - 126 U/L   Total Bilirubin 0.7 0.3 - 1.2 mg/dL   GFR calc non Af Amer >60 >60 mL/min   GFR calc Af Amer >60 >60 mL/min   Anion gap 10 5 - 15    Comment: Performed at [facility], [address] [place], [place] [address]  Lipase, blood     Status: None   Collection Time: 08/29/18  7:22 PM  Result Value Ref Range   Lipase 41 11 - 51 U/L    Comment: Performed at [facility], [address] [place], [place] [address]  Urinalysis, Complete w Microscopic     Status: Abnormal   Collection Time: 08/29/18  7:22 PM  Result Value Ref Range   Color, Urine YELLOW (A) YELLOW   APPearance CLOUDY (A) CLEAR   Specific Gravity, Urine 1.028 1.005 - 1.030   pH 7.0 5.0 - 8.0   Glucose, UA NEGATIVE NEGATIVE mg/dL   Hgb urine dipstick NEGATIVE  NEGATIVE   Bilirubin Urine NEGATIVE NEGATIVE   Ketones, ur 5 (A) NEGATIVE mg/dL   Protein, ur 30 (A) NEGATIVE mg/dL   Nitrite NEGATIVE NEGATIVE   Leukocytes,Ua NEGATIVE NEGATIVE   RBC / HPF 6-10 0 - 5 RBC/hpf   WBC, UA NONE SEEN 0 - 5 WBC/hpf   Bacteria, UA NONE SEEN NONE SEEN   Squamous Epithelial / LPF NONE SEEN 0 - 5   Mucus PRESENT    Amorphous Crystal PRESENT     Comment: Performed at [facility], [address], [place], [place] [address]   Ct Abdomen Pelvis W Contrast  Result Date: 08/30/2018 CLINICAL DATA:  Diverticulitis.  Left lower abdominal pain EXAM: CT ABDOMEN AND PELVIS WITH CONTRAST TECHNIQUE: Multidetector CT imaging of the abdomen and pelvis was performed using the standard protocol following bolus administration of intravenous contrast. CONTRAST:  OMNIPAQUE IOHEXOL 300 MG/ML  SOLN COMPARISON:  None. FINDINGS: Lower chest: The heart size is enlarged. Hepatobiliary: No focal liver abnormality is seen. No gallstones, gallbladder wall thickening, or biliary dilatation. Pancreas: Unremarkable. No pancreatic ductal dilatation or surrounding inflammatory changes. Spleen: Normal in size without focal abnormality. Adrenals/Urinary Tract: Adrenal glands are unremarkable. Kidneys are normal, without renal calculi, focal lesion, or hydronephrosis. Bladder is unremarkable. Stomach/Bowel: There is some mild wall thickening and hyperemia surrounding a long segment of the sigmoid colon. The appendix is dilated measuring approximately 1.2 cm proximally and 0.8 cm distally. There are some mild periappendiceal inflammatory changes. The stomach is unremarkable. There is no evidence of a small-bowel obstruction. Vascular/Lymphatic: Aortic atherosclerosis. No enlarged abdominal or pelvic lymph nodes. Reproductive: The prostate gland is enlarged. Other: There is a small fat containing periumbilical hernia. There is no abdominal ascites. Musculoskeletal: No acute or significant osseous  findings. IMPRESSION: 1. Overall findings most concerning for acute uncomplicated appendicitis. The appendix is located in the right lower quadrant and stretches across towards the patient's midline. 2. Mild cardiomegaly. Electronically Signed   By: Katherine Mantle M.D.   On: 08/30/2018 00:17    Pending Labs Unresulted Labs (From admission, onward)    Start     Ordered   08/30/18 0113  SARS Coronavirus 2 (CEPHEID - Performed in [facility] hospital lab), Hosp Order  (Asymptomatic Patients Labs)  Once,   STAT  Question:  Rule Out  Answer:  Yes   08/30/18 0112   08/30/18 0113  HIV antibody (Routine Testing)  Once,   STAT     08/30/18 0119   08/30/18 0113  CBC  (heparin)  Once,   STAT    Comments:  Baseline for heparin therapy IF NOT ALREADY DRAWN.  Notify MD if PLT < 100 K.    08/30/18 0119   08/30/18 0113  Creatinine, serum  (heparin)  Once,   STAT    Comments:  Baseline for heparin therapy IF NOT ALREADY DRAWN.    08/30/18 0119          Vitals/Pain Today's Vitals   08/29/18 1918 08/29/18 2230 08/29/18 2300 08/30/18 0100  BP: 125/66 134/77 119/75 124/75  Pulse: (!) 58 61 (!) 55 (!) 56  Resp: 18  17 17   Temp: 98 F (36.7 C)     TempSrc: Oral     SpO2: 99% 99% 100% 95%  Weight: 81.6 kg     Height: 5\' 5"  (1.651 m)     PainSc: 8        Isolation Precautions No active isolations  Medications Medications  sodium chloride 0.9 % bolus 1,000 mL (has no administration in time range)  piperacillin-tazobactam (ZOSYN) IVPB 3.375 g (3.375 g Intravenous New Bag/Given 08/30/18 0121)  heparin injection 5,000 Units (has no administration in time range)  0.9 %  sodium chloride infusion (has no administration in time range)  piperacillin-tazobactam (ZOSYN) IVPB 3.375 g (has no administration in time range)  ketorolac (TORADOL) 30 MG/ML injection 30 mg (has no administration in time range)  morphine 2 MG/ML injection 2 mg (has no administration in time range)  ondansetron (ZOFRAN-ODT)  disintegrating tablet 4 mg (has no administration in time range)    Or  ondansetron (ZOFRAN) injection 4 mg (has no administration in time range)  prochlorperazine (COMPAZINE) tablet 10 mg (has no administration in time range)    Or  prochlorperazine (COMPAZINE) injection 5-10 mg (has no administration in time range)  pantoprazole (PROTONIX) injection 40 mg (has no administration in time range)  hydrALAZINE (APRESOLINE) injection 10 mg (has no administration in time range)  sodium chloride 0.9 % bolus 500 mL (500 mLs Intravenous New Bag/Given 08/29/18 2238)  iohexol (OMNIPAQUE) 240 MG/ML injection 50 mL (50 mLs Oral Contrast Given 08/29/18 2233)  iohexol (OMNIPAQUE) 300 MG/ML solution 100 mL (100 mLs Intravenous Contrast Given 08/30/18 0001)  sodium chloride 0.9 % bolus 1,000 mL (1,000 mLs Intravenous New Bag/Given 08/30/18 0121)    Mobility walks Low fall risk   Focused Assessments GI   R Recommendations: See Admitting Provider Note  Report given to:   Additional Notes: Preferred language: Spanish

## 2018-08-30 NOTE — Op Note (Signed)
Operative Note  Laparoscopic Appendectomy   Ricardo Ayers Date of operation:  08/30/2018  Indications: The patient presented with a history of  abdominal pain. Workup has revealed findings consistent with acute appendicitis.  Pre-operative Diagnosis: Acute appendicitis without mention of peritonitis  Post-operative Diagnosis: Same  Surgeon: Duanne Guess, MD  Anesthesia: GETA  Findings: Inflamed appendix without perforation  Estimated Blood Loss: Less than 10 cc         Specimens: appendix         Complications: None immediately apparent  Procedure Details  The patient was seen again in the preop area. The options of surgery versus observation were reviewed with the patient and/or family. The risks of bleeding, infection, recurrence of symptoms, negative laparoscopy, potential for an open procedure, bowel injury, abscess or infection, were all reviewed as well. The patient was taken to Operating Room, identified as Ricardo Ayers and the procedure verified as laparoscopic appendectomy. A time out was performed and the above information confirmed.  The patient was placed in the supine position and general anesthesia was induced.  Antibiotic prophylaxis was administered and VT E prophylaxis was in place. A Foley catheter was placed by the nursing staff.   The abdomen was prepped and draped in a sterile fashion.  Optiview technique was utilized to enter the abdomen in the right upper quadrant.. Pneumoperitoneum obtained.  An umbilical port was placed under direct visualization along with two 5 mm working ports.  The appendix was identified and found to be acutely inflamed without evidence of perforation.  No pus in the pelvis or retrocecal space The appendix was carefully dissected. The mesoappendix was divided with Harmonic scalpel.  There was some persistent oozing which was managed with 2 ligaclips The base of the appendix was dissected out and divided with  a standard load Endo GIA.The appendix was placed in a Endopouch bag and removed via the umbilical port. The right lower quadrant and pelvis was then irrigated with normal saline which was then aspirated. The right lower quadrant was inspected there was no sign of bleeding or bowel injury therefore pneumoperitoneum was released, all ports were removed.  The umbilical fascia was closed with 0 Vicryl interrupted sutures and the skin incisions were approximated with subcuticular 4-0 Monocryl. Dermabond was applied The patient tolerated the procedure well and there were no immediately apparent complications. The sponge lap and needle count were correct at the end of the procedure.  The patient was taken to the recovery room in stable condition to be admitted for continued care.   Duanne Guess, MD, FACS

## 2018-08-30 NOTE — ED Provider Notes (Signed)
I assumed care of the patient from Dr. Alphonzo Lemmings at 11:00 PM with recommendation to follow-up on CT scan of the abdomen pelvis.  CT scan revealed uncomplicated acute appendicitis.  Patient subsequently discussed with Dr. Elenor Legato general surgeon who will admit the patient.   Darci Current, MD 08/30/18 [phone]

## 2018-08-31 ENCOUNTER — Encounter: Payer: Self-pay | Admitting: General Surgery

## 2018-08-31 LAB — HIV ANTIBODY (ROUTINE TESTING W REFLEX): HIV Screen 4th Generation wRfx: NONREACTIVE

## 2018-08-31 MED ORDER — OXYCODONE HCL 5 MG PO TABS: 5.0000 mg | ORAL_TABLET | Freq: Four times a day (QID) | ORAL | 0 refills | Status: DC | PRN

## 2018-08-31 NOTE — Discharge Summary (Signed)
[facility] SURGICAL DISCHARGE SUMMARY  Patient ID: Ricardo Ayers MRN: [id number] DOB/AGE: Jul 22, 1960 58 y.o.  Admit date: 08/29/2018 Discharge date: 08/31/2018  Discharge Diagnoses Patient Active Problem List   Diagnosis Date Noted  . Appendicitis 08/30/2018    Consultants None  Procedures 08/30/2018:  Laparoscopic Appendectomy  HPI: 58 y.o. male presented to [facility] ED overnight for abdominal pain. Patient reports left sided abdominal pain which onset yesterday afternoon. He reports the pain waxed and waned throughout the day but has resolved since admission. He did not try anything to improve the pain. He has some associated nausea but that resolved. No fevers, chills, cough, congestion, SOB, CP, or bladder/bowel changes. No history of similar pain. No previous abdominal surgeries. Work up in the ED was concerning for acute appendicitis.   Hospital Course: Informed consent was obtained and documented, and patient underwent uneventful laparoscopic appendectomy (Dr Lady Gary, 08/30/2018).  Post-operatively, patient's pain improved/resolved and advancement of patient's diet and ambulation were well-tolerated. The remainder of patient's hospital course was essentially unremarkable, and discharge planning was initiated accordingly with patient safely able to be discharged home with appropriate discharge instructions, pain control, and outpatient follow-up after all of his questions were answered to his expressed satisfaction.  Discharge Condition: Good   Physical Examination:  Constitutional: Well appearing male, NAD Pulmonary: Normal effort, no respiratory distress Gastrointestinal: Soft, incisional soreness, non-distended. No rebound/fuarding Skin: Laparoscopic incisions are CDI with skin glue, no erythema or drainage   Allergies as of 08/31/2018   No Known Allergies     Medication List    TAKE these medications   acetaminophen 325 MG tablet Commonly  known as:  TYLENOL Take 650 mg by mouth every 6 (six) hours as needed for mild pain.   oxyCODONE 5 MG immediate release tablet Commonly known as:  Oxy IR/ROXICODONE Take 1 tablet (5 mg total) by mouth every 6 (six) hours as needed for severe pain.        Follow-up Information    Duanne Guess, MD. Schedule an appointment as soon as possible for a visit in 2 week(s).   Specialty:  General Surgery Why:  s/p lap appy Contact information: [address] [place] [address] [phone]            Time spent on discharge management including discussion of hospital course, clinical condition, outpatient instructions, prescriptions, and follow up with the patient and members of the medical team: >30 minutes  -- Lynden Oxford , PA-C [facility]  08/31/2018, 10:57 AM [phone] M-F: 7am - 4pm

## 2018-08-31 NOTE — Progress Notes (Signed)
Discharge instructions reviewed with patient utilizing Spanish interpreter including followup visits and new medications.  Understanding was verbalized and all questions were answered.  IV removed without complication; patient tolerated well.  Patient discharged home via wheelchair in stable condition escorted by nursing staff.

## 2018-08-31 NOTE — Discharge Instructions (Signed)
In addition to included general post-operative instructions for laparoscopic,  Diet: Resume home heart healthy diet.   Activity: No heavy lifting >20 pounds (children, pets, laundry, garbage) or strenuous activity until follow-up in 2 weeks, but light activity and walking are encouraged. Do not drive or drink alcohol if taking narcotic pain medications.  Wound care: 2 days after surgery (06/06), you may shower/get incision wet with soapy water and pat dry (do not rub incisions), but no baths or submerging incision underwater until follow-up.   Medications: Resume all home medications. For mild to moderate pain: acetaminophen (Tylenol) or ibuprofen/naproxen (if no kidney disease). Combining Tylenol with alcohol can substantially increase your risk of causing liver disease. Narcotic pain medications, if prescribed, can be used for severe pain, though may cause nausea, constipation, and drowsiness. Do not combine Tylenol and Percocet (or similar) within a 6 hour period as Percocet (and similar) contain(s) Tylenol. If you do not need the narcotic pain medication, you do not need to fill the prescription.  Call office [phone] / [phone]) at any time if any questions, worsening pain, fevers/chills, bleeding, drainage from incision site, or other concerns.

## 2018-09-03 LAB — SURGICAL PATHOLOGY

## 2018-09-12 ENCOUNTER — Ambulatory Visit (INDEPENDENT_AMBULATORY_CARE_PROVIDER_SITE_OTHER): Payer: BC Managed Care – PPO | Admitting: General Surgery

## 2018-09-12 ENCOUNTER — Encounter: Payer: Self-pay | Admitting: General Surgery

## 2018-09-12 ENCOUNTER — Other Ambulatory Visit: Payer: Self-pay

## 2018-09-12 VITALS — BP 139/89 | HR 65 | Temp 97.9°F | Ht 65.0 in | Wt 183.2 lb

## 2018-09-12 DIAGNOSIS — Z9049 Acquired absence of other specified parts of digestive tract: Secondary | ICD-10-CM

## 2018-09-12 NOTE — Progress Notes (Signed)
Raahil Ong is here today for postoperative visit after undergoing a laparoscopic appendectomy.  This visit was completed with the assistance of a Spanish language interpreter.  He presented to the emergency department at [facility] on 29 August 2018.  I performed a straightforward laparoscopic appendectomy on the fourth.  He recovered without incident and was discharged to home the following day.  He reports that since his surgery he has been doing well.  He has had minimal pain.  He denies any fevers or chills.  He is eating a regular diet and is not having any issues with diarrhea or constipation.  Vitals:   09/12/18 0857  BP: 139/89  Pulse: 65  Temp: 97.9 F (36.6 C)  SpO2: 100%   Focused abdominal exam: His abdomen is soft, nontender, nondistended.  The laparoscopic port sites are healing well without evidence of infection.  Assessment and plan: Yoon Barca is a 58 year old man who underwent an uncomplicated laparoscopic appendectomy.  Final pathology did not show any signs of malignancy or perforation.  He is doing well.  He may return to work and resume all of his usual activities, with the caveat that he should not lift anything heavier than 10 pounds for another 2 weeks.  He was provided with a return to work note with the stipulation.  I will see him on an as-needed basis.

## 2018-09-12 NOTE — Patient Instructions (Signed)
You may return to work on 09/15/18. No lifting more than 10 pounds for the next two weeks. Return as needed.

## 2018-09-19 ENCOUNTER — Other Ambulatory Visit: Payer: Self-pay

## 2018-09-19 ENCOUNTER — Ambulatory Visit (INDEPENDENT_AMBULATORY_CARE_PROVIDER_SITE_OTHER): Payer: BC Managed Care – PPO | Admitting: General Surgery

## 2018-09-19 ENCOUNTER — Encounter: Payer: Self-pay | Admitting: General Surgery

## 2018-09-19 VITALS — BP 126/83 | HR 73 | Temp 97.2°F | Ht 65.0 in | Wt 182.4 lb

## 2018-09-19 DIAGNOSIS — Z9049 Acquired absence of other specified parts of digestive tract: Secondary | ICD-10-CM

## 2018-09-19 NOTE — Patient Instructions (Addendum)
The patient is aware to call back for any questions or new concerns.  El paciente est atento a volver a [employer] para cualquier pregunta o inquietud nueva.   drink lots of water eat foods low in fat try to eat fruits and vegtables  eat lighter foods such as baked chicken or fish if pain is severe may take narcotic pain medications otherwise try to only take tylenol or ibuprofen work note completed  follow up as scheduled July 6    beber mucha agua comer alimentos bajos en grasa trata de comer frutas y verduras comer alimentos ms ligeros como pollo o pescado al horno Si el dolor es intenso, puede tomar analgsicos narcticos; de lo contrario, trate de tomar solo tylenol o ibuprofeno nota de trabajo completada seguimiento segn lo programado el [employer] de [employer]

## 2018-09-19 NOTE — Progress Notes (Signed)
Mr. Ricardo Ayers contacted our office today complaining of pain in his lower abdomen.  He is here for an urgent evaluation.  He underwent an uncomplicated laparoscopic appendectomy on 3 June.  I saw him in clinic last week, at which time he was doing well.  He reports that he started work again this past weekend.  He has been working 10 to 12-hour shifts and states that after work on Sunday, he developed significant pain in both the right and left lower quadrants.  He denies any vomiting, but states that he occasionally has some nausea, depending on what food he eats..  He has not had any fevers or chills.  He says that this morning, he ate pancakes and coffee and that seemed to make his pain worse.  He also says that he walks a lot and that this has also exacerbated his discomfort.  When he is at rest, he does not have any pain.  He has not had any fevers or chills, nor has he had any diarrhea or constipation.  Vitals:   09/19/18 1532  BP: 126/83  Pulse: 73  Temp: (!) 97.2 F (36.2 C)  SpO2: 97%   Focused abdominal exam: His laparoscopic port sites are healing well.  There is no evidence of hernia in any of them.  There is no erythema, induration, or purulent drainage.  He is tender to moderate palpation around the left lower quadrant port site.  Impression and plan: Ricardo Ayers is a 58 year old man who underwent a straightforward laparoscopic appendectomy for acute appendicitis.  He was doing well a week ago, however he returned to work over the past weekend and has experienced worsening abdominal discomfort.  It sounds like he has probably just overexerted himself and cause some pain around the muscles that are still healing from port site placement.  I am not certain what the relationship of the specific foods is to his discomfort, but I have counseled him to stay on a lighter diet, low in fat.  He may continue to use ibuprofen or Tylenol for his discomfort as needed.  He should also be sure that he  has adequate hydration.  He requested an additional 2 weeks out of work and we have provided him a note to that effect.  He would also like to be seen in clinic again, prior to returning to work.  We will make that appointment for him and see him then.

## 2018-10-01 ENCOUNTER — Ambulatory Visit (INDEPENDENT_AMBULATORY_CARE_PROVIDER_SITE_OTHER): Payer: Self-pay | Admitting: General Surgery

## 2018-10-01 ENCOUNTER — Other Ambulatory Visit: Payer: Self-pay

## 2018-10-01 ENCOUNTER — Encounter: Payer: Self-pay | Admitting: General Surgery

## 2018-10-01 VITALS — BP 130/70 | HR 68 | Temp 97.7°F | Ht 67.0 in | Wt 178.0 lb

## 2018-10-01 DIAGNOSIS — Z9049 Acquired absence of other specified parts of digestive tract: Secondary | ICD-10-CM

## 2018-10-01 NOTE — Progress Notes (Signed)
Ricardo Ayers returns to clinic today.  He is a 58 year old man who underwent an uncomplicated laparoscopic appendectomy on 4 June.  He returned to work about 2 weeks after his operation and experienced severe pain near his incision sites.  Him in clinic on June 24, I agreed to give him another 2 weeks out of work.  Today's visit is to establish whether or not he is ready to return to full duties.  Our interview was conducted with the assistance of a Spanish language interpreter.    He states that overall, he is doing well.  He still notices some discomfort with additional exertion.  He is concerned that he will have to come out of work again, if he returns at this time.  He says that he is eating whatever he wants, having regular bowel movements, has not experienced any nausea or vomiting, and has had no fevers or chills.  He reports that he is gradually building up his stamina, by walking 30 minutes every couple of hours.  He would like another 2 weeks out of work.  He states that his employer is very understanding and would prefer that he not come back until he is completely ready.  Vitals:   10/01/18 0923  BP: 130/70  Pulse: 68  Temp: 97.7 F (36.5 C)  SpO2: 98%   Focused abdominal exam: All of the laparoscopic port sites are healing nicely.  They are already beginning to fade.  There is no erythema, induration, or drainage present.  Impression and plan: [name] is a 58 year old man who had a laparoscopic appendectomy at the beginning of June.  He returned to work approximately 2 weeks later, but feels like he overexerted himself.  Elated that it takes up to 6 months to fully recover from surgery, but that he should continue building up his stamina.  As long as his employer is understanding, I am willing to give him an additional 2 weeks off of work.  He says that he feels this will be adequate.  A note was provided to that effect.  We will see him on an as-needed basis.

## 2018-10-01 NOTE — Patient Instructions (Addendum)
Return as needed. Return to work on  7/20/020. The patient is aware to call back for any questions or concerns.

## 2018-10-22 ENCOUNTER — Other Ambulatory Visit: Payer: Self-pay

## 2018-10-22 ENCOUNTER — Emergency Department
Admission: EM | Admit: 2018-10-22 | Discharge: 2018-10-23 | Disposition: A | Discharge status: Home / Self Care | Payer: BC Managed Care – PPO | Attending: Emergency Medicine | Admitting: Emergency Medicine

## 2018-10-22 ENCOUNTER — Emergency Department: Payer: BC Managed Care – PPO

## 2018-10-22 ENCOUNTER — Encounter: Payer: Self-pay | Admitting: Emergency Medicine

## 2018-10-22 DIAGNOSIS — R1032 Left lower quadrant pain: Secondary | ICD-10-CM | POA: Diagnosis present

## 2018-10-22 DIAGNOSIS — G8918 Other acute postprocedural pain: Secondary | ICD-10-CM | POA: Insufficient documentation

## 2018-10-22 LAB — URINALYSIS, COMPLETE (UACMP) WITH MICROSCOPIC
Bacteria, UA: NONE SEEN
Bilirubin Urine: NEGATIVE
Glucose, UA: NEGATIVE mg/dL
Ketones, ur: 80 mg/dL — AB
Leukocytes,Ua: NEGATIVE
Nitrite: NEGATIVE
Protein, ur: NEGATIVE mg/dL
Specific Gravity, Urine: 1.026 (ref 1.005–1.030)
Squamous Epithelial / HPF: NONE SEEN (ref 0–5)
pH: 6 (ref 5.0–8.0)

## 2018-10-22 LAB — CBC
HCT: 51.1 % (ref 39.0–52.0)
Hemoglobin: 17.2 g/dL — ABNORMAL HIGH (ref 13.0–17.0)
MCH: 29.5 pg (ref 26.0–34.0)
MCHC: 33.7 g/dL (ref 30.0–36.0)
MCV: 87.7 fL (ref 80.0–100.0)
Platelets: 268 10*3/uL (ref 150–400)
RBC: 5.83 MIL/uL — ABNORMAL HIGH (ref 4.22–5.81)
RDW: 12.8 % (ref 11.5–15.5)
WBC: 7.8 10*3/uL (ref 4.0–10.5)
nRBC: 0 % (ref 0.0–0.2)

## 2018-10-22 LAB — COMPREHENSIVE METABOLIC PANEL
ALT: 44 U/L (ref 0–44)
AST: 30 U/L (ref 15–41)
Albumin: 4.7 g/dL (ref 3.5–5.0)
Alkaline Phosphatase: 82 U/L (ref 38–126)
Anion gap: 11 (ref 5–15)
BUN: 8 mg/dL (ref 6–20)
CO2: 24 mmol/L (ref 22–32)
Calcium: 9.3 mg/dL (ref 8.9–10.3)
Chloride: 101 mmol/L (ref 98–111)
Creatinine, Ser: 0.87 mg/dL (ref 0.61–1.24)
GFR calc Af Amer: >60 mL/min (ref >60)
GFR calc non Af Amer: >60 mL/min (ref >60)
Glucose, Bld: 125 mg/dL — ABNORMAL HIGH (ref 70–99)
Potassium: 3.8 mmol/L (ref 3.5–5.1)
Sodium: 136 mmol/L (ref 135–145)
Total Bilirubin: 0.9 mg/dL (ref 0.3–1.2)
Total Protein: 8 g/dL (ref 6.5–8.1)

## 2018-10-22 LAB — TROPONIN I (HIGH SENSITIVITY): Troponin I (High Sensitivity): 2 ng/L (ref <18)

## 2018-10-22 LAB — LIPASE, BLOOD: Lipase: 31 U/L (ref 11–51)

## 2018-10-22 MED ORDER — MORPHINE SULFATE (PF) 4 MG/ML IV SOLN
4.0000 mg | Freq: Once | INTRAVENOUS | Status: AC
  Administered 2018-10-22: 23:00:00 4 mg via INTRAVENOUS
  Filled 2018-10-22: qty 1

## 2018-10-22 MED ORDER — ONDANSETRON 4 MG PO TBDP
4.0000 mg | ORAL_TABLET | Freq: Once | ORAL | Status: AC
  Administered 2018-10-22: 4 mg via ORAL
  Filled 2018-10-22: qty 1

## 2018-10-22 MED ORDER — SODIUM CHLORIDE 0.9% FLUSH: 3.0000 mL | Freq: Once | INTRAVENOUS | Status: DC

## 2018-10-22 MED ORDER — IOHEXOL 300 MG/ML  SOLN
100.0000 mL | Freq: Once | INTRAMUSCULAR | Status: AC | PRN
  Administered 2018-10-22: 100 mL via INTRAVENOUS

## 2018-10-22 NOTE — ED Notes (Signed)
Pt has left lower abd pain   Sx for 2 days.  Pt had appendectomy 2 months ago and is taking pain meds without relief.  Pt states pain worse with walking.

## 2018-10-22 NOTE — ED Notes (Signed)
Urine collected and sent.

## 2018-10-22 NOTE — ED Provider Notes (Signed)
[facility] Emergency Department Provider Note       Time seen: ----------------------------------------- 10:27 PM on 10/22/2018 -----------------------------------------   I have reviewed the triage vital signs and the nursing notes.  HISTORY   Chief Complaint Post-op Problem    HPI Ricardo Ayers is a 58 y.o. male with a history of appendicitis who presents to the ED for severe left lower quadrant pain is worse with walking.  He has taken prescribed narcotics for his appendectomy for this pain without any improvement.  He denies any fevers, chills, chest pain, shortness of breath, vomiting.  Last bowel movement was before arrival.  Pain is been going on for the past 2 days.  He had an appendectomy 2 months ago.  History reviewed. No pertinent past medical history.  Patient Active Problem List   Diagnosis Date Noted  . Appendicitis 08/30/2018    Past Surgical History:  Procedure Laterality Date  . LAPAROSCOPIC APPENDECTOMY N/A 08/30/2018   Procedure: APPENDECTOMY LAPAROSCOPIC;  Surgeon: Duanne Guessannon, Jennifer, MD;  Location: [facility] ORS;  Service: General;  Laterality: N/A;    Allergies Patient has no known allergies.  Social History Social History   Tobacco Use  . Smoking status: Never Smoker  . Smokeless tobacco: Never Used  Substance Use Topics  . Alcohol use: Not on file  . Drug use: Not on file    Review of Systems Constitutional: Negative for fever. Cardiovascular: Negative for chest pain. Respiratory: Negative for shortness of breath. Gastrointestinal: Positive for abdominal pain Musculoskeletal: Negative for back pain. Skin: Negative for rash. Neurological: Negative for headaches, focal weakness or numbness.  All systems negative/normal/unremarkable except as stated in the HPI  ____________________________________________   PHYSICAL EXAM:  VITAL SIGNS: ED Triage Vitals [10/22/18 1945]  Enc Vitals Group     BP 119/84      Pulse Rate 91     Resp 17     Temp      Temp src      SpO2 98 %     Weight      Height      Head Circumference      Peak Flow      Pain Score      Pain Loc      Pain Edu?      Excl. in GC?     Constitutional: Alert and oriented.  Mild distress Eyes: Conjunctivae are normal. Normal extraocular movements. ENT      Head: Normocephalic and atraumatic.      Nose: No congestion/rhinnorhea.      Mouth/Throat: Mucous membranes are moist.      Neck: No stridor. Cardiovascular: Normal rate, regular rhythm. No murmurs, rubs, or gallops. Respiratory: Normal respiratory effort without tachypnea nor retractions. Breath sounds are clear and equal bilaterally. No wheezes/rales/rhonchi. Gastrointestinal: Left lower quadrant tenderness, possible guarding is noted Musculoskeletal: Nontender with normal range of motion in extremities. No lower extremity tenderness nor edema. Neurologic:  Normal speech and language. No gross focal neurologic deficits are appreciated.  Skin:  Skin is warm, dry and intact. No rash noted. Psychiatric: Mood and affect are normal. Speech and behavior are normal.  ____________________________________________  ED COURSE:  As part of my medical decision making, I reviewed the following data within the electronic MEDICAL RECORD NUMBER History obtained from family if available, nursing notes, old chart and ekg, as well as notes from prior ED visits. Patient presented for left lower quadrant pain, we will assess with labs and imaging as indicated  at this time.   Procedures  Ricardo Ayers was evaluated in Emergency Department on 10/22/2018 for the symptoms described in the history of present illness. He was evaluated in the context of the global COVID-19 pandemic, which necessitated consideration that the patient might be at risk for infection with the SARS-CoV-2 virus that causes COVID-19. Institutional protocols and algorithms that pertain to the evaluation of  patients at risk for COVID-19 are in a state of rapid change based on information released by regulatory bodies including the CDC and federal and state organizations. These policies and algorithms were followed during the patient's care in the ED.  ____________________________________________   LABS (pertinent positives/negatives)  Labs Reviewed  COMPREHENSIVE METABOLIC PANEL - Abnormal; Notable for the following components:      Result Value   Glucose, Bld 125 (*)    All other components within normal limits  CBC - Abnormal; Notable for the following components:   RBC 5.83 (*)    Hemoglobin 17.2 (*)    All other components within normal limits  LIPASE, BLOOD  URINALYSIS, COMPLETE (UACMP) WITH MICROSCOPIC  TROPONIN I (HIGH SENSITIVITY)    RADIOLOGY  CT the abdomen pelvis with contrast is pending  ____________________________________________   DIFFERENTIAL DIAGNOSIS   Diverticulitis, renal colic, pyelonephritis, muscle strain, abscess  FINAL ASSESSMENT AND PLAN  Left lower quadrant pain   Plan: The patient had presented for left lower quadrant pain for the past 48 hours. Patient's labs were grossly unremarkable. Patient's imaging is still pending at this point.   Laurence Aly, MD    Note: This note was generated in part or whole with voice recognition software. Voice recognition is usually quite accurate but there are transcription errors that can and very often do occur. I apologize for any typographical errors that were not detected and corrected.     Earleen Newport, MD 10/22/18 2229

## 2018-10-22 NOTE — ED Triage Notes (Addendum)
Pt reports he had appendectomy x2 months ago. Pt c/o pain on left side of abdomen and SOB when walking x2 days. Pt denies N/V. Last BM before arrival.

## 2018-10-22 NOTE — ED Notes (Signed)
Iv started  meds given.   

## 2018-10-23 MED ORDER — TRAMADOL HCL 50 MG PO TABS: 50.0000 mg | ORAL_TABLET | Freq: Four times a day (QID) | ORAL | 0 refills | Status: AC | PRN

## 2018-10-23 NOTE — ED Provider Notes (Signed)
I assumed care of the patient from Dr. Jimmye Norman at 11:00 PM with recommendation to follow-up on CT abdomen pelvis.  CT revealed no acute abdominal pathology per radiologist.  Patient states that pain is resolved at this time.  Patient be referred to gastroenterology for further outpatient evaluation.   Gregor Hams, MD 10/23/18 0200

## 2018-10-23 NOTE — ED Notes (Signed)
Pt resting quietly.

## 2018-10-30 ENCOUNTER — Encounter: Payer: Self-pay | Admitting: Gastroenterology

## 2018-10-30 ENCOUNTER — Other Ambulatory Visit: Payer: Self-pay

## 2018-10-30 ENCOUNTER — Ambulatory Visit (INDEPENDENT_AMBULATORY_CARE_PROVIDER_SITE_OTHER): Payer: BC Managed Care – PPO | Admitting: Gastroenterology

## 2018-10-30 VITALS — BP 116/74 | HR 67 | Temp 97.6°F | Ht 67.0 in | Wt 173.4 lb

## 2018-10-30 DIAGNOSIS — R109 Unspecified abdominal pain: Secondary | ICD-10-CM | POA: Diagnosis not present

## 2018-10-30 DIAGNOSIS — K5900 Constipation, unspecified: Secondary | ICD-10-CM

## 2018-10-30 MED ORDER — POLYETHYLENE GLYCOL 3350 17 G PO PACK: 17.0000 g | PACK | Freq: Every day | ORAL | 2 refills | Status: AC

## 2018-10-30 NOTE — Progress Notes (Signed)
Ricardo Ayers [address]  [place]  [place], [place] [address]  Main: [phone]  Fax: [phone]   Gastroenterology Consultation  Referring Provider:     Dr. Marjean Donna Primary Care Physician:  Patient, No Pcp Per Reason for Consultation:     Abdominal pain        HPI:    Chief Complaint  Patient presents with  . Follow-up    ED f/u LLQ pain    Ricardo Ayers is a 58 y.o. y/o male referred for consultation & management  by Dr. Patient, No Pcp Per.  Patient evaluated with a Spanish interpreter via video services.  Patient went to the ER 1 to 2 weeks ago with left-sided abdominal pain and was referred to follow-up with [place] with reassuring labs and CT scan at that ER visit.  Patient describes left lower back to left flank pain at the time of that ER visit.  He states he was told that that he has air in his colon and was given docusate and lactulose after which she had multiple bowel movements and his pain resolved.  He does not have the pain at this time.  No nausea or vomiting.  No blood in stool.  No family history of colon cancer.  No prior EGD or colonoscopy.  Prior to this in June 2020 he had both right and left-sided abdominal pain and went to the ER and CT showed uncomplicated appendicitis.  Patient underwent surgery for this.  He did not have pain at the same location when he went to the ER 1 to 2 weeks ago.  Past medical history: Appendicitis  Past Surgical History:  Procedure Laterality Date  . LAPAROSCOPIC APPENDECTOMY N/A 08/30/2018   Procedure: APPENDECTOMY LAPAROSCOPIC;  Surgeon: Fredirick Maudlin, MD;  Location: [facility] ORS;  Service: General;  Laterality: N/A;    Prior to Admission medications   Medication Sig Start Date End Date Taking? Authorizing Provider  docusate sodium (COLACE) 100 MG capsule TAKE 1 CAPSULE BY MOUTH 3 (THREE) TIMES DAILY AS NEEDED FOR CONSTIPATION FOR UP TO 10 DAYS 10/24/18  Yes [provider]  lactulose  (CHRONULAC) 10 GM/15ML solution TAKE 15 MLS BY MOUTH 2 (TWO) TIMES DAILY FOR 10 DAYS 10/24/18  Yes [provider]  oxyCODONE (OXY IR/ROXICODONE) 5 MG immediate release tablet Take 5 mg by mouth every 6 (six) hours as needed for severe pain.   Yes [provider]  acetaminophen (TYLENOL) 325 MG tablet Take 650 mg by mouth every 6 (six) hours as needed for mild pain.    [provider]  polyethylene glycol (MIRALAX / GLYCOLAX) 17 g packet Take 17 g by mouth daily. 10/30/18   Virgel Manifold, MD  traMADol (ULTRAM) 50 MG tablet Take 1 tablet (50 mg total) by mouth every 6 (six) hours as needed. Patient not taking: Reported on 10/30/2018 10/23/18 10/23/19  Gregor Hams, MD    Family History  Problem Relation Age of Onset  . Colon cancer Neg Hx      Social History   Tobacco Use  . Smoking status: Former Research scientist (life sciences)  . Smokeless tobacco: Never Used  Substance Use Topics  . Alcohol use: Not Currently    Frequency: Never  . Drug use: Not on file    Allergies as of 10/30/2018  . (No Known Allergies)    Review of Systems:    All systems reviewed and negative except where noted in HPI.   Physical Exam:  BP  116/74   Pulse 67   Temp 97.6 F (36.4 C) (Oral)   Ht 5\' 7"  (1.702 m)   Wt 173 lb 6.4 oz (78.7 kg)   BMI 27.16 kg/m  No LMP for male patient. Psych:  Alert and cooperative. Normal mood and affect. General:   Alert,  Well-developed, well-nourished, pleasant and cooperative in NAD Head:  Normocephalic and atraumatic. Eyes:  Sclera clear, no icterus.   Conjunctiva pink. Ears:  Normal auditory acuity. Nose:  No deformity, discharge, or lesions. Mouth:  No deformity or lesions,oropharynx pink & moist. Neck:  Supple; no masses or thyromegaly. Abdomen:  Normal bowel sounds.  No bruits.  Soft, non-tender and non-distended without masses, hepatosplenomegaly or hernias noted.  No guarding or rebound tenderness.    Msk:  Symmetrical without gross deformities.  Good, equal movement & strength bilaterally. Pulses:  Normal pulses noted. Extremities:  No clubbing or edema.  No cyanosis. Neurologic:  Alert and oriented x3;  grossly normal neurologically. Skin:  Intact without significant lesions or rashes. No jaundice. Lymph Nodes:  No significant cervical adenopathy. Psych:  Alert and cooperative. Normal mood and affect.   Labs: CBC    Component Value Date/Time   WBC 7.8 10/22/2018 1950   RBC 5.83 (H) 10/22/2018 1950   HGB 17.2 (H) 10/22/2018 1950   HCT 51.1 10/22/2018 1950   PLT 268 10/22/2018 1950   MCV 87.7 10/22/2018 1950   MCH 29.5 10/22/2018 1950   MCHC 33.7 10/22/2018 1950   RDW 12.8 10/22/2018 1950   LYMPHSABS 1.8 08/29/2018 1922   MONOABS 0.5 08/29/2018 1922   EOSABS 0.0 08/29/2018 1922   BASOSABS 0.1 08/29/2018 1922   CMP     Component Value Date/Time   NA 136 10/22/2018 1950   K 3.8 10/22/2018 1950   CL 101 10/22/2018 1950   CO2 24 10/22/2018 1950   GLUCOSE 125 (H) 10/22/2018 1950   BUN 8 10/22/2018 1950   CREATININE 0.87 10/22/2018 1950   CALCIUM 9.3 10/22/2018 1950   PROT 8.0 10/22/2018 1950   ALBUMIN 4.7 10/22/2018 1950   AST 30 10/22/2018 1950   ALT 44 10/22/2018 1950   ALKPHOS 82 10/22/2018 1950   BILITOT 0.9 10/22/2018 1950   GFRNONAA >60 10/22/2018 1950   GFRAA >60 10/22/2018 1950    Imaging Studies: Ct Abdomen Pelvis W Contrast  Result Date: 10/23/2018 CLINICAL DATA:  Left abdominal pain, shortness of breath x2 days. Prior appendectomy. EXAM: CT ABDOMEN AND PELVIS WITH CONTRAST TECHNIQUE: Multidetector CT imaging of the abdomen and pelvis was performed using the standard protocol following bolus administration of intravenous contrast. CONTRAST:  100mL OMNIPAQUE IOHEXOL 300 MG/ML  SOLN COMPARISON:  08/30/2018 FINDINGS: Lower chest: Bilateral lower lobe atelectasis. Hepatobiliary: Liver is within normal limits. Gallbladder is unremarkable. No intrahepatic or extrahepatic ductal dilatation. Pancreas: Within  normal limits. Spleen: Within normal limits. Adrenals/Urinary Tract: Adrenal glands are within normal limits. Kidneys are within normal limits.  No hydronephrosis. Bladder is within normal limits. Stomach/Bowel: Stomach is within normal limits. No evidence of bowel obstruction. Prior appendectomy. No colonic wall thickening or inflammatory changes. Vascular/Lymphatic: No evidence of abdominal aortic aneurysm. No suspicious abdominopelvic lymphadenopathy. Reproductive: Prostate is unremarkable. Other: No abdominopelvic ascites. Musculoskeletal: Mild degenerative changes of the visualized thoracolumbar spine, most prominent at L5-S1. IMPRESSION: No CT findings to account for the patient's left abdominal pain. Specifically, no evidence of diverticulitis. No evidence of bowel obstruction.  Prior appendectomy. Electronically Signed   By: Charline BillsSriyesh  Krishnan M.D.   On:  10/23/2018 00:09    Assessment and Plan:   Ricardo Ayers is a 58 y.o. y/o male has been referred for pain at his left lower back  His pain has now resolved  It was likely musculoskeletal in nature versus constipation induced.  However, given the location was likely musculoskeletal and more than constipation induced.  Patient states the pain got better after he had bowel movements with docusate and lactulose.  He has finished these medications.  We discussed high-fiber diet   Also discussed using MiraLAX daily to prevent constipation as he reports that he is constipated more often than not.  We discussed screening colonoscopy given his age.  Patient states he will think about it.  The risk of underlying colon malignancy, and ruling out obstructive lesions were discussed.  Patient would not like to schedule at this time and would like to call us back if he changes his mind.  Risks and benefits of procedure discussed in detail with the help of a Spanish interpreter.  Patient states he is just here for his constipation and  abdominal pain and would just like something prescribed for it and is not interested in colorectal cancer screening at this time.    Dr Melodie BouillonVarnita Dimitra Woodstock  Speech recognition software was used to dictate the above note.

## 2018-10-31 ENCOUNTER — Other Ambulatory Visit: Payer: Self-pay

## 2018-10-31 ENCOUNTER — Emergency Department: Payer: BC Managed Care – PPO

## 2018-10-31 ENCOUNTER — Emergency Department
Admission: EM | Admit: 2018-10-31 | Discharge: 2018-10-31 | Disposition: A | Discharge status: Home / Self Care | Payer: BC Managed Care – PPO | Attending: Emergency Medicine | Admitting: Emergency Medicine

## 2018-10-31 ENCOUNTER — Telehealth: Payer: Self-pay | Admitting: Gastroenterology

## 2018-10-31 ENCOUNTER — Encounter: Payer: Self-pay | Admitting: Emergency Medicine

## 2018-10-31 DIAGNOSIS — R1030 Lower abdominal pain, unspecified: Secondary | ICD-10-CM | POA: Insufficient documentation

## 2018-10-31 DIAGNOSIS — Z79899 Other long term (current) drug therapy: Secondary | ICD-10-CM | POA: Insufficient documentation

## 2018-10-31 DIAGNOSIS — Z87891 Personal history of nicotine dependence: Secondary | ICD-10-CM | POA: Insufficient documentation

## 2018-10-31 LAB — CBC
HCT: 47.8 % (ref 39.0–52.0)
Hemoglobin: 16.3 g/dL (ref 13.0–17.0)
MCH: 29.6 pg (ref 26.0–34.0)
MCHC: 34.1 g/dL (ref 30.0–36.0)
MCV: 86.8 fL (ref 80.0–100.0)
Platelets: 270 10*3/uL (ref 150–400)
RBC: 5.51 MIL/uL (ref 4.22–5.81)
RDW: 12.8 % (ref 11.5–15.5)
WBC: 8.4 10*3/uL (ref 4.0–10.5)
nRBC: 0 % (ref 0.0–0.2)

## 2018-10-31 LAB — COMPREHENSIVE METABOLIC PANEL
ALT: 49 U/L — ABNORMAL HIGH (ref 0–44)
AST: 28 U/L (ref 15–41)
Albumin: 4.7 g/dL (ref 3.5–5.0)
Alkaline Phosphatase: 79 U/L (ref 38–126)
Anion gap: 13 (ref 5–15)
BUN: 7 mg/dL (ref 6–20)
CO2: 21 mmol/L — ABNORMAL LOW (ref 22–32)
Calcium: 9.4 mg/dL (ref 8.9–10.3)
Chloride: 104 mmol/L (ref 98–111)
Creatinine, Ser: 0.73 mg/dL (ref 0.61–1.24)
GFR calc Af Amer: >60 mL/min (ref >60)
GFR calc non Af Amer: >60 mL/min (ref >60)
Glucose, Bld: 121 mg/dL — ABNORMAL HIGH (ref 70–99)
Potassium: 3.7 mmol/L (ref 3.5–5.1)
Sodium: 138 mmol/L (ref 135–145)
Total Bilirubin: 0.9 mg/dL (ref 0.3–1.2)
Total Protein: 8.1 g/dL (ref 6.5–8.1)

## 2018-10-31 LAB — URINALYSIS, COMPLETE (UACMP) WITH MICROSCOPIC
Bacteria, UA: NONE SEEN
Bilirubin Urine: NEGATIVE
Glucose, UA: NEGATIVE mg/dL
Ketones, ur: 20 mg/dL — AB
Leukocytes,Ua: NEGATIVE
Nitrite: NEGATIVE
Protein, ur: NEGATIVE mg/dL
Specific Gravity, Urine: 1.002 — ABNORMAL LOW (ref 1.005–1.030)
Squamous Epithelial / HPF: NONE SEEN (ref 0–5)
pH: 7 (ref 5.0–8.0)

## 2018-10-31 LAB — LIPASE, BLOOD: Lipase: 46 U/L (ref 11–51)

## 2018-10-31 LAB — LACTIC ACID, PLASMA: Lactic Acid, Venous: 1.4 mmol/L (ref 0.5–1.9)

## 2018-10-31 MED ORDER — KETOROLAC TROMETHAMINE 30 MG/ML IJ SOLN
30.0000 mg | Freq: Once | INTRAMUSCULAR | Status: AC
  Administered 2018-10-31: 30 mg via INTRAVENOUS
  Filled 2018-10-31: qty 1

## 2018-10-31 MED ORDER — SODIUM CHLORIDE 0.9% FLUSH
3.0000 mL | Freq: Once | INTRAVENOUS | Status: AC
  Administered 2018-10-31: 3 mL via INTRAVENOUS

## 2018-10-31 MED ORDER — SODIUM CHLORIDE 0.9 % IV BOLUS
1000.0000 mL | Freq: Once | INTRAVENOUS | Status: AC
  Administered 2018-10-31: 1000 mL via INTRAVENOUS

## 2018-10-31 MED ORDER — DIAZEPAM 5 MG PO TABS: 5.0000 mg | ORAL_TABLET | Freq: Three times a day (TID) | ORAL | 0 refills | Status: AC | PRN

## 2018-10-31 MED ORDER — ONDANSETRON HCL 4 MG/2ML IJ SOLN
4.0000 mg | Freq: Once | INTRAMUSCULAR | Status: AC
  Administered 2018-10-31: 4 mg via INTRAVENOUS
  Filled 2018-10-31: qty 2

## 2018-10-31 NOTE — ED Provider Notes (Signed)
[facility] Emergency Department Provider Note ____________________________________________   First MD Initiated Contact with Patient 10/31/18 [phone]     (approximate)  I have reviewed the triage vital signs and the nursing notes.   HISTORY  Chief Complaint Abdominal Pain   HPI Ricardo Ayers is a 58 y.o. male with PMH as noted below who presents with lower abdominal pain over the last 2 months, acutely worse over the last 3 hours.  It is mostly in the left lower quadrant although sometimes on the right as well.  It is associated with nausea but no vomiting.  He states his last bowel movement was yesterday.  The patient also reports difficulty urinating and states that this is new.  The patient states that the symptoms started after he had an appendectomy 2 months ago.  He was seen here in the ED last week when the pain got worse and had a negative work-up.  He subsequently went to an urgent care and had a GI follow-up yesterday.  History reviewed. No pertinent past medical history.  Patient Active Problem List   Diagnosis Date Noted  . Appendicitis 08/30/2018    Past Surgical History:  Procedure Laterality Date  . LAPAROSCOPIC APPENDECTOMY N/A 08/30/2018   Procedure: APPENDECTOMY LAPAROSCOPIC;  Surgeon: Fredirick Maudlin, MD;  Location: [facility] ORS;  Service: General;  Laterality: N/A;    Prior to Admission medications   Medication Sig Start Date End Date Taking? Authorizing Provider  acetaminophen (TYLENOL) 325 MG tablet Take 650 mg by mouth every 6 (six) hours as needed for mild pain.    [provider]  docusate sodium (COLACE) 100 MG capsule TAKE 1 CAPSULE BY MOUTH 3 (THREE) TIMES DAILY AS NEEDED FOR CONSTIPATION FOR UP TO 10 DAYS 10/24/18   [provider]  lactulose (CHRONULAC) 10 GM/15ML solution TAKE 15 MLS BY MOUTH 2 (TWO) TIMES DAILY FOR 10 DAYS 10/24/18   [provider]  oxyCODONE (OXY IR/ROXICODONE) 5 MG  immediate release tablet Take 5 mg by mouth every 6 (six) hours as needed for severe pain.    [provider]  polyethylene glycol (MIRALAX / GLYCOLAX) 17 g packet Take 17 g by mouth daily. 10/30/18   Virgel Manifold, MD  traMADol (ULTRAM) 50 MG tablet Take 1 tablet (50 mg total) by mouth every 6 (six) hours as needed. Patient not taking: Reported on 10/30/2018 10/23/18 10/23/19  Gregor Hams, MD    Allergies Patient has no known allergies.  Family History  Problem Relation Age of Onset  . Colon cancer Neg Hx     Social History Social History   Tobacco Use  . Smoking status: Former Research scientist (life sciences)  . Smokeless tobacco: Never Used  Substance Use Topics  . Alcohol use: Not Currently    Frequency: Never  . Drug use: Not on file    Review of Systems  Constitutional: No fever. Eyes: No redness. ENT: No sore throat. Cardiovascular: Denies chest pain. Respiratory: Denies shortness of breath. Gastrointestinal: Positive for nausea.  No vomiting or diarrhea. Genitourinary: Positive for difficulty urinating and frequency. Musculoskeletal: Negative for back pain. Skin: Negative for rash. Neurological: Negative for headache.   ____________________________________________   PHYSICAL EXAM:  VITAL SIGNS: ED Triage Vitals [10/31/18 0528]  Enc Vitals Group     BP 135/90     Pulse Rate 96     Resp (!) 26     Temp 97.8 F (36.6 C)     Temp Source Oral  SpO2 100 %     Weight 173 lb 6.4 oz (78.7 kg)     Height 5\' 7"  (1.702 m)     Head Circumference      Peak Flow      Pain Score      Pain Loc      Pain Edu?      Excl. in GC?     Constitutional: Alert and oriented.  Uncomfortable and anxious appearing but in no acute distress. Eyes: Conjunctivae are normal.  No scleral icterus. Head: Atraumatic. Nose: No congestion/rhinnorhea. Mouth/Throat: Mucous membranes are moist.   Neck: Normal range of motion.  Cardiovascular: Good peripheral circulation. Respiratory:  Increased respiratory rate.  No retractions. Gastrointestinal: Soft with moderate left lower quadrant tenderness and mild right lower quadrant tenderness.  No peritoneal signs. Genitourinary: No CVA or flank tenderness. Musculoskeletal:  Extremities warm and well perfused.  Neurologic:  Normal speech and language. No gross focal neurologic deficits are appreciated.  Skin:  Skin is warm and dry. No rash noted. Psychiatric: Mood and affect are normal. Speech and behavior are normal.  ____________________________________________   LABS (all labs ordered are listed, but only abnormal results are displayed)  Labs Reviewed  COMPREHENSIVE METABOLIC PANEL - Abnormal; Notable for the following components:      Result Value   CO2 21 (*)    Glucose, Bld 121 (*)    ALT 49 (*)    All other components within normal limits  URINALYSIS, COMPLETE (UACMP) WITH MICROSCOPIC - Abnormal; Notable for the following components:   Color, Urine COLORLESS (*)    APPearance CLEAR (*)    Specific Gravity, Urine 1.002 (*)    Hgb urine dipstick SMALL (*)    Ketones, ur 20 (*)    All other components within normal limits  LIPASE, BLOOD  CBC  LACTIC ACID, PLASMA  LACTIC ACID, PLASMA   ____________________________________________  EKG   ____________________________________________  RADIOLOGY  CT abdomen: Pending  ____________________________________________   PROCEDURES  Procedure(s) performed: No  Procedures  Critical Care performed: No ____________________________________________   INITIAL IMPRESSION / ASSESSMENT AND PLAN / ED COURSE  Pertinent labs & imaging results that were available during my care of the patient were reviewed by me and considered in my medical decision making (see chart for details).  58 year old male with PMH as noted above presents with intermittent abdominal pain over the last 2 months with an acute episode over the last several hours.  He reports nausea but no  vomiting and also has some urinary discomfort and difficulty urinating, which is new.  I reviewed the past medical records in epic.  The patient had an appendectomy here in early June and was discharged without complication.  Subsequently, the patient was seen as follows: 1.  ED evaluation here on 7/27-28 with negative CT and unremarkable labs. 2.  [facility] with negative abdominal x-ray.  The patient was diagnosed with likely constipation and started on docusate and lactulose. 3.  GI follow-up with Dr. Maximino Greenlandahiliani yesterday.  Per the note, the patient was asymptomatic at that time.  He was diagnosed with likely musculoskeletal versus constipation induced pain.  On exam today the patient is anxious appearing and is intermittently hyperventilating especially when I start to palpate the abdomen.  His vital signs are normal except for the respiratory rate, however he is able to breathe more slowly when calm.  He does have some lower abdominal tenderness especially on the left.  He denies  any radiation into his groin or any testicular pain, and he has no flank or back pain or tenderness.  Given the patient's multiple recent work-ups and the location and quality of the pain, I have a low suspicion for acute intra-abdominal pathology.  I suspect there has been a component of constipation that may have been induced or exacerbated by opiate pain medication use after his surgery.  It also may be musculoskeletal as suspected by the GI specialist.  Although the patient had a negative urinalysis on 7/27, given the presence of urinary symptoms now differential also includes UTI, pyelonephritis, or less likely ureteral stone.  Since the pain has not changed in location or quality and is intermittent, I do not suspect bowel obstruction or other acute postsurgical complication.  Given the normal vital signs and the lack of any vascular disease history I also do not suspect renal infarct,  mesenteric ischemia, or other acute vascular etiology.  We will obtain repeat lab work-up including lactic acid and urinalysis.  Given the patient's tenderness, repeat imaging is warranted so I will obtain a CT abdomen without contrast to evaluate for ureteral stone or other acute intra-abdominal cause.  ----------------------------------------- 6:55 AM on 10/31/2018 -----------------------------------------  Lactic acid and CT are pending.  I signed the patient out to the oncoming physician Dr. Mayford KnifeWilliams.  ____________________________________________   FINAL CLINICAL IMPRESSION(S) / ED DIAGNOSES  Final diagnoses:  Lower abdominal pain      NEW MEDICATIONS STARTED DURING THIS VISIT:  New Prescriptions   No medications on file     Note:  This document was prepared using Dragon voice recognition software and may include unintentional dictation errors.    Dionne BucySiadecki, Louis Gaw, MD 10/31/18 [phone]

## 2018-10-31 NOTE — ED Notes (Signed)
Second lactic acid not needed per williams, MD.

## 2018-10-31 NOTE — ED Notes (Signed)
Pt is being discharged to home. Pt is Aox4, VSS, pt does not show any signs of distress. AVS/RX was given and explained to the pt and son utilizing the remote spanish interpreter and they verbalized understanding of all information.

## 2018-10-31 NOTE — ED Notes (Signed)
ED Provider at bedside. 

## 2018-10-31 NOTE — Telephone Encounter (Signed)
Letter has been faxed to daughter

## 2018-10-31 NOTE — Telephone Encounter (Signed)
Pt daughter called stating she needs a note for her job stating she was there Translating for pt at his apt on 10/30/18 please e-mail to  E-mail to [email or web address] if any questions call  cb  [phone]

## 2018-10-31 NOTE — ED Provider Notes (Signed)
CT negative, he is cleared for outpatient follow-up.   Earleen Newport, MD 10/31/18 [phone]

## 2018-10-31 NOTE — ED Triage Notes (Signed)
Pt arrived via POV with reports of abdominal pain on the left side, pt states he has been having trouble urinating.   Pt also seen recently at [facility] and was told he had trapped gas.  Pain started about 3 hours ago.

## 2018-10-31 NOTE — Telephone Encounter (Signed)
Pt daughter is calling  About prev. Message please call her as soon as possible

## 2018-11-05 ENCOUNTER — Encounter: Payer: Self-pay | Admitting: Physician Assistant

## 2018-11-05 ENCOUNTER — Other Ambulatory Visit: Payer: Self-pay

## 2018-11-05 ENCOUNTER — Ambulatory Visit (INDEPENDENT_AMBULATORY_CARE_PROVIDER_SITE_OTHER): Payer: BC Managed Care – PPO | Admitting: Physician Assistant

## 2018-11-05 VITALS — BP 143/88 | HR 80 | Temp 97.7°F | Ht 64.0 in | Wt 180.0 lb

## 2018-11-05 DIAGNOSIS — Z09 Encounter for follow-up examination after completed treatment for conditions other than malignant neoplasm: Secondary | ICD-10-CM

## 2018-11-05 DIAGNOSIS — Z9049 Acquired absence of other specified parts of digestive tract: Secondary | ICD-10-CM

## 2018-11-05 NOTE — Progress Notes (Signed)
[facility] POST-OP OFFICE VISIT  11/05/2018  HPI: Ricardo Ayers is a 58 y.o. male 2 months s/p laparoscopic appendectomy with Dr Celine Ahr.  Patient's son at bedside to help interpret history and obtain physical examination.   Ricardo Ayers presents today with complaints of left sided abdominal and flank pain. Ricardo Ayers reports that Ricardo Ayers has had this pain intermittently since the surgery. This pain primarily onsets with strenuous activity or laying down for a long time. Ricardo Ayers does actually report relief with activity/walking. Ricardo Ayers was seen multiple times in the month following surgery by Dr Celine Ahr for this was post-operative pain. Ricardo Ayers then presented to [facility] ED on 07/27 for similar pain and was worked up with unremarkable lab work and CT which was unremarkable. Ricardo Ayers Followed up with GI on 08/04 and thought this was most likely musculoskeletal pain vs constipation. Ricardo Ayers was additionally offered screening colonoscopy but was uncertain of if Ricardo Ayers wanted to do this or not. Ricardo Ayers then went to [facility] ED again on 08/05 for this pain and work up again was unremarkable, and referred back for outpatient follow up. In the interim, Ricardo Ayers present to [facility] ED in [place] and abdominal XR showed non-obstructive gas pattern. Of note, on his ED visit on 07/27, Ricardo Ayers was prescribed Valium and Tramadol for muscle spasm and pain. Ricardo Ayers took this twice and Ricardo Ayers felt this helped but was told to stop them. There was also question of possible hernia in his LLQ site based on family reports.   CT Abdomen/Pelvis from 07/27 and 08/05 personally reviewed which did not show any intra-abdominal process, no evidence of hernia. It did show Spondylosis and L5-S1 disc narrowing. Facet hypertrophy.   Vital signs: BP (!) 143/88   Pulse 80   Temp 97.7 F (36.5 C) (Skin)   Ht 5\' 4"  (1.626 m)   Wt 180 lb (81.6 kg)   SpO2 98%   BMI 30.90 kg/m    Physical Exam: Constitutional: Well appearing male, NAD Abdomen: Soft, mild soreness in LLQ-flank, no  rebound, no guarding, no peritonitis. No evidence of hernia. At any incision site. No CVA tenderness bilaterally Musculoskeletal: No paraspinal back tenderness, Ricardo Ayers does endorse some reproduction of symptoms with SLR on the left.  Skin: Laparoscopic incisions are well healed, no erythema, no drainage.   Assessment/Plan: This is a 58 y.o. male 2 months s/p laparoscopic appendectomy who presents with intermittent left sided abdominal and flank pain. I do not suspect this is a complication, such as hernia, abscess, nor wound infection related to his recent appendectomy. I suspect this is more musculoskeletal in nature given pain pattern and CT findings.    - Recommended establishing with PCP to better coordinate care and work up potential MSK pain  - Recommend trying valium +/- tramadol as prescribed in ED to help with suspected MSK pain  - Okay for abdominal binder if Ricardo Ayers feels this is helping  - Activity as tolerates, no restrictions from surgical standpoint  - RTC PRN or call with questions  -- Edison Simon, PA-C [facility] 11/05/2018, 11:03 AM [phone] M-F: 7am - 4pm

## 2019-02-05 ENCOUNTER — Ambulatory Visit: Payer: BC Managed Care – PPO | Admitting: Gastroenterology

## 2019-02-08 ENCOUNTER — Other Ambulatory Visit: Payer: Self-pay

## 2019-02-08 ENCOUNTER — Other Ambulatory Visit
Admission: RE | Admit: 2019-02-08 | Discharge: 2019-02-08 | Disposition: A | Discharge status: Home / Self Care | Payer: BC Managed Care – PPO | Source: Ambulatory Visit | Attending: Internal Medicine | Admitting: Internal Medicine

## 2019-02-08 DIAGNOSIS — Z20828 Contact with and (suspected) exposure to other viral communicable diseases: Secondary | ICD-10-CM | POA: Diagnosis not present

## 2019-02-08 DIAGNOSIS — Z01812 Encounter for preprocedural laboratory examination: Secondary | ICD-10-CM | POA: Diagnosis not present

## 2019-02-09 LAB — SARS CORONAVIRUS 2 (TAT 6-24 HRS): SARS Coronavirus 2: NEGATIVE

## 2019-02-12 ENCOUNTER — Encounter: Payer: Self-pay | Admitting: *Deleted

## 2019-02-13 ENCOUNTER — Ambulatory Visit
Admission: RE | Admit: 2019-02-13 | Payer: BC Managed Care – PPO | Source: Home / Self Care | Admitting: Internal Medicine

## 2019-02-13 ENCOUNTER — Encounter: Admission: RE | Payer: Self-pay | Source: Home / Self Care

## 2019-02-13 HISTORY — DX: Other specified health status: Z78.9

## 2019-02-13 SURGERY — COLONOSCOPY WITH PROPOFOL
Anesthesia: General

## 2019-06-06 ENCOUNTER — Ambulatory Visit: Admit: 2019-06-06 | Payer: BC Managed Care – PPO | Admitting: Internal Medicine

## 2019-06-06 SURGERY — COLONOSCOPY WITH PROPOFOL
Anesthesia: General

## 2019-11-15 IMAGING — CT CT ABDOMEN AND PELVIS WITH CONTRAST
2 of 5 series · 16 of 46 positions shown, 18 images · IV contrast (omnipaque)
Comparison: None.

CLINICAL DATA: Diverticulitis.  Left lower abdominal pain

EXAM:
CT ABDOMEN AND PELVIS WITH CONTRAST
TECHNIQUE: Multidetector CT imaging of the abdomen and pelvis was performed
using the standard protocol following bolus administration of
intravenous contrast.
CONTRAST:  100mL OMNIPAQUE IOHEXOL 300 MG/ML  SOLN

[Series 2: axial st · axial · 0.83mm/px · z∈[-586,-156]mm · 13 of 98 slices shown, 15 images]
[im 6/98  soft-tissue]
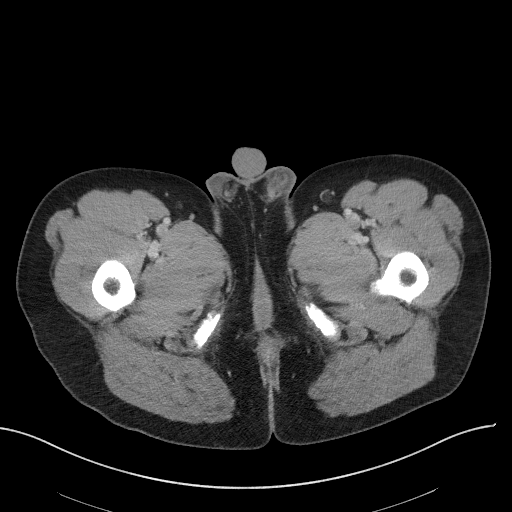
[im 6/98  bone]
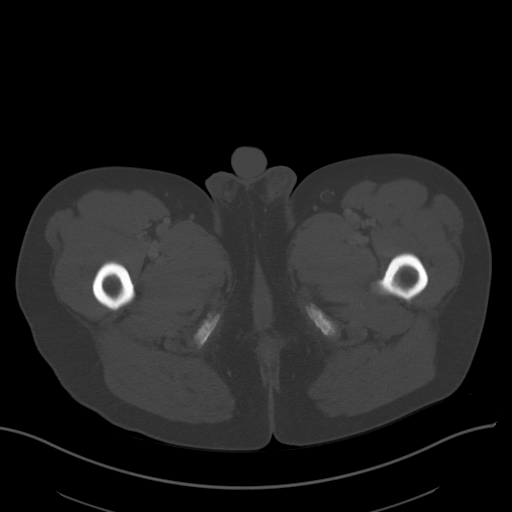
[im 11/98  soft-tissue]
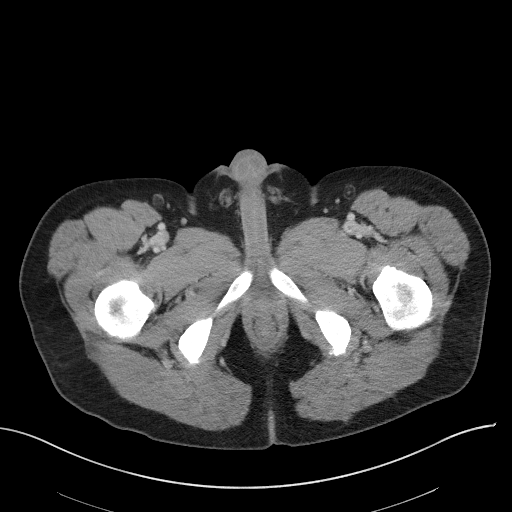
[im 22/98  soft-tissue]
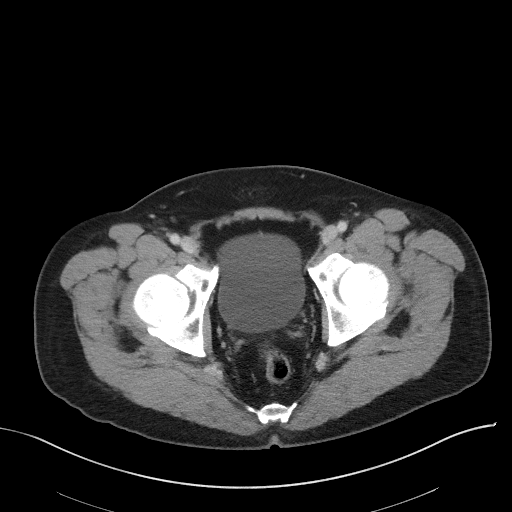
[im 27/98  soft-tissue]
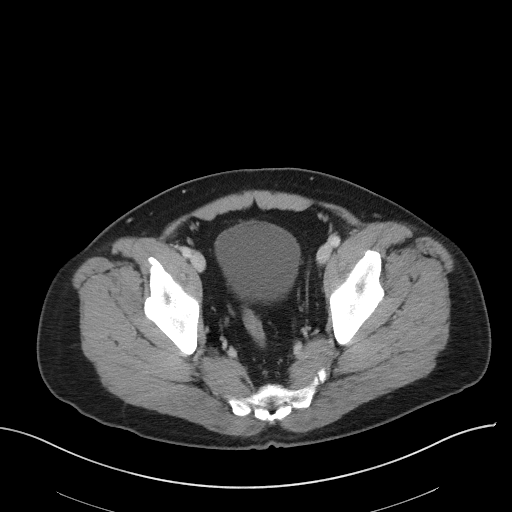
[im 33/98  soft-tissue]
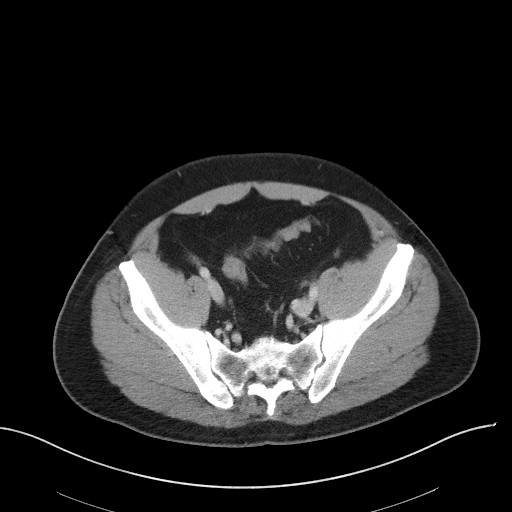
[im 44/98  soft-tissue]
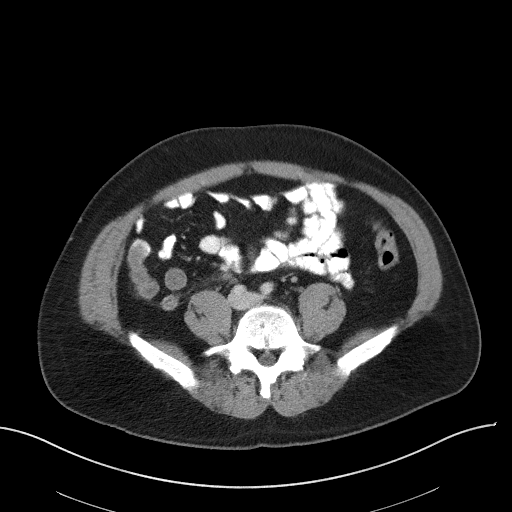
[im 49/98  soft-tissue]
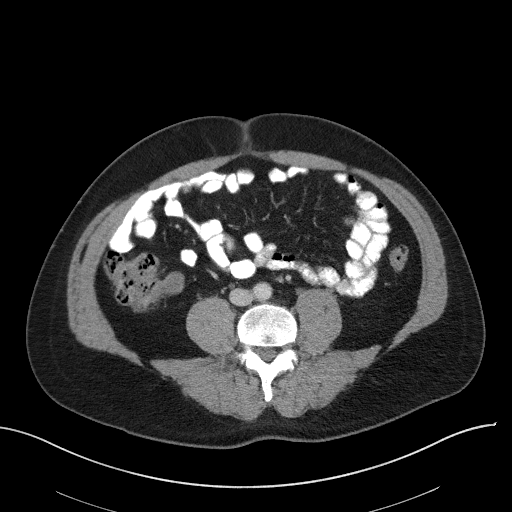
[im 54/98  soft-tissue]
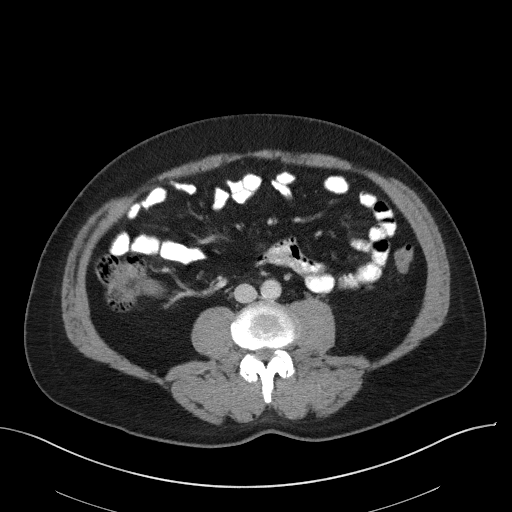
[im 65/98  soft-tissue]
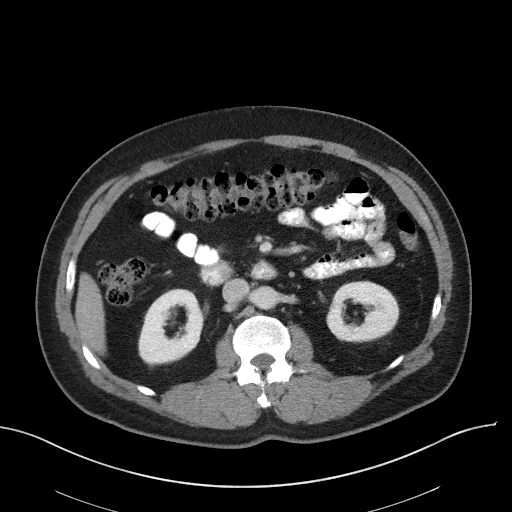
[im 65/98  bone]
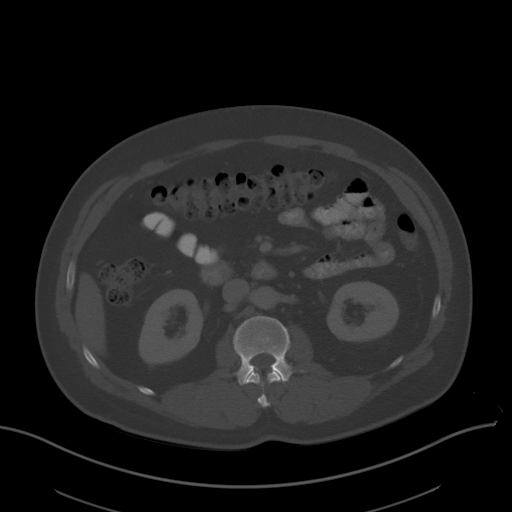
[im 71/98  soft-tissue]
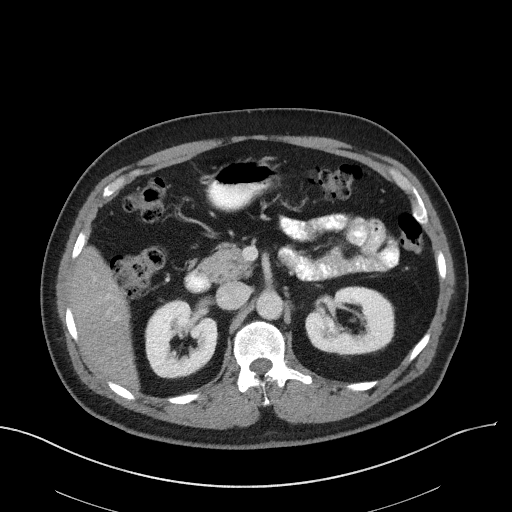
[im 76/98  soft-tissue]
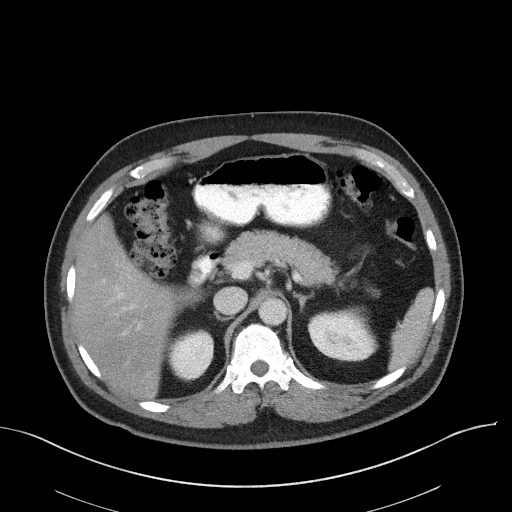
[im 87/98  soft-tissue]
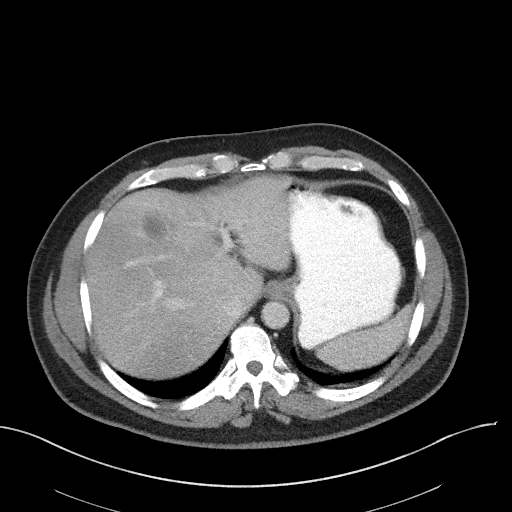
[im 92/98  soft-tissue]
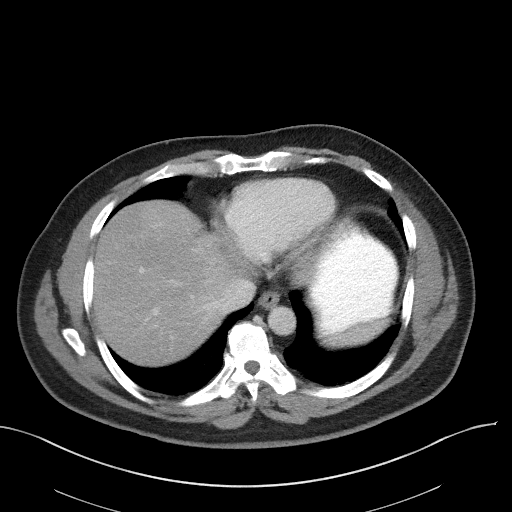

[Series 5: coronal st · coronal · 0.80mm/px · 3 of 89 slices shown]
[im 30/89  soft-tissue]
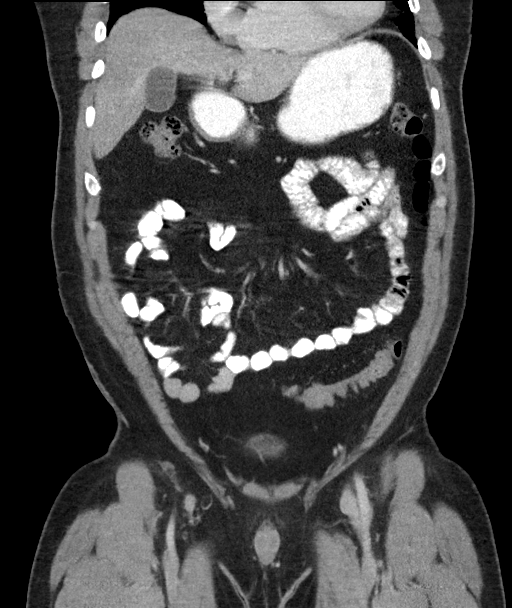
[im 40/89  soft-tissue]
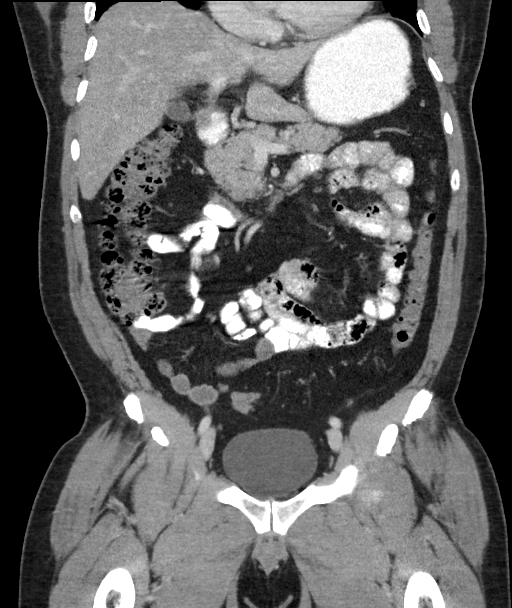
[im 49/89  soft-tissue]
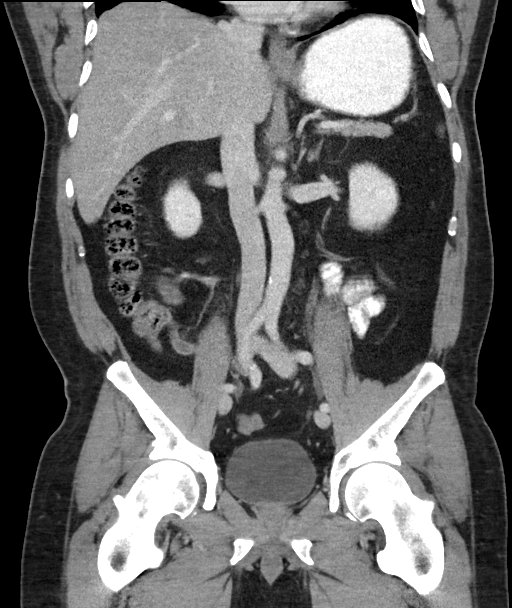

[16 of 46 positions shown; findings below may reference images not displayed]

FINDINGS: Lower chest: The heart size is enlarged.

Hepatobiliary: No focal liver abnormality is seen. No gallstones,
gallbladder wall thickening, or biliary dilatation.

Pancreas: Unremarkable. No pancreatic ductal dilatation or
surrounding inflammatory changes.

Spleen: Normal in size without focal abnormality.

Adrenals/Urinary Tract: Adrenal glands are unremarkable. Kidneys are
normal, without renal calculi, focal lesion, or hydronephrosis.
Bladder is unremarkable.

Stomach/Bowel: There is some mild wall thickening and hyperemia
surrounding a long segment of the sigmoid colon. The appendix is
dilated measuring approximately 1.2 cm proximally and 0.8 cm
distally. There are some mild periappendiceal inflammatory changes.
The stomach is unremarkable. There is no evidence of a small-bowel
obstruction.

Vascular/Lymphatic: Aortic atherosclerosis. No enlarged abdominal or
pelvic lymph nodes.

Reproductive: The prostate gland is enlarged.

Other: There is a small fat containing periumbilical hernia. There
is no abdominal ascites.

Musculoskeletal: No acute or significant osseous findings.
IMPRESSION: 1. Overall findings most concerning for acute uncomplicated
appendicitis. The appendix is located in the right lower quadrant
and stretches across towards the patient's midline.
2. Mild cardiomegaly.

## 2021-01-04 ENCOUNTER — Encounter: Payer: Self-pay | Admitting: General Surgery
